# Patient Record
Sex: Male | Born: 1935 | Race: White | Hispanic: No | Marital: Single | Smoking: Never smoker
Health system: Southern US, Community
[De-identification: ages and names within clinical notes are randomized; demographics above are authoritative.]

## PROBLEM LIST (undated history)

## (undated) DIAGNOSIS — F329 Major depressive disorder, single episode, unspecified: Secondary | ICD-10-CM

## (undated) DIAGNOSIS — F32A Depression, unspecified: Secondary | ICD-10-CM

## (undated) DIAGNOSIS — K5909 Other constipation: Secondary | ICD-10-CM

## (undated) DIAGNOSIS — Z842 Family history of other diseases of the genitourinary system: Secondary | ICD-10-CM

## (undated) DIAGNOSIS — D649 Anemia, unspecified: Secondary | ICD-10-CM

## (undated) DIAGNOSIS — E46 Unspecified protein-calorie malnutrition: Secondary | ICD-10-CM

## (undated) HISTORY — DX: Depression, unspecified: F32.A

## (undated) HISTORY — DX: Other constipation: K59.09

## (undated) HISTORY — DX: Major depressive disorder, single episode, unspecified: F32.9

## (undated) HISTORY — DX: Anemia, unspecified: D64.9

## (undated) HISTORY — DX: Unspecified protein-calorie malnutrition: E46

## (undated) HISTORY — PX: OTHER SURGICAL HISTORY: SHX169

## (undated) HISTORY — DX: Family history of other diseases of the genitourinary system: Z84.2

---

## 2002-05-27 ENCOUNTER — Encounter: Payer: Self-pay | Admitting: Emergency Medicine

## 2002-05-27 ENCOUNTER — Emergency Department (HOSPITAL_COMMUNITY): Admission: EM | Admit: 2002-05-27 | Discharge: 2002-05-27 | Payer: Self-pay | Admitting: Emergency Medicine

## 2002-06-05 ENCOUNTER — Encounter: Payer: Self-pay | Admitting: Emergency Medicine

## 2002-06-05 ENCOUNTER — Encounter: Payer: Self-pay | Admitting: Internal Medicine

## 2002-06-05 ENCOUNTER — Inpatient Hospital Stay (HOSPITAL_COMMUNITY): Admission: EM | Admit: 2002-06-05 | Discharge: 2002-06-08 | Payer: Self-pay | Admitting: Emergency Medicine

## 2002-06-07 ENCOUNTER — Encounter (INDEPENDENT_AMBULATORY_CARE_PROVIDER_SITE_OTHER): Payer: Self-pay

## 2004-08-04 ENCOUNTER — Inpatient Hospital Stay (HOSPITAL_COMMUNITY): Admission: EM | Admit: 2004-08-04 | Discharge: 2004-08-11 | Payer: Self-pay | Admitting: Emergency Medicine

## 2004-08-06 ENCOUNTER — Ambulatory Visit: Payer: Self-pay | Admitting: Cardiology

## 2004-08-06 ENCOUNTER — Encounter: Payer: Self-pay | Admitting: Cardiology

## 2006-05-06 ENCOUNTER — Emergency Department (HOSPITAL_COMMUNITY): Admission: EM | Admit: 2006-05-06 | Discharge: 2006-05-06 | Payer: Self-pay | Admitting: Emergency Medicine

## 2006-09-13 ENCOUNTER — Emergency Department (HOSPITAL_COMMUNITY): Admission: EM | Admit: 2006-09-13 | Discharge: 2006-09-13 | Payer: Self-pay | Admitting: *Deleted

## 2008-01-06 ENCOUNTER — Inpatient Hospital Stay (HOSPITAL_COMMUNITY): Admission: AD | Admit: 2008-01-06 | Discharge: 2008-01-14 | Payer: Self-pay

## 2008-01-06 ENCOUNTER — Encounter: Payer: Self-pay | Admitting: Emergency Medicine

## 2008-01-07 ENCOUNTER — Encounter: Payer: Self-pay | Admitting: Family Medicine

## 2008-01-07 LAB — CONVERTED CEMR LAB
B-12, serum: 470 pg/mL
TIBC: 175 ug/dL
TSH: 2.168 microintl units/mL

## 2008-01-11 ENCOUNTER — Encounter: Payer: Self-pay | Admitting: Family Medicine

## 2008-01-11 LAB — CONVERTED CEMR LAB
HCT: 31.5 %
Hemoglobin: 11 g/dL
MCV: 99.2 fL
platelet count: 169 10*3/uL

## 2008-01-12 ENCOUNTER — Encounter: Payer: Self-pay | Admitting: Family Medicine

## 2008-01-12 LAB — CONVERTED CEMR LAB
ALT: 12 units/L
Albumin: 2.9 g/dL
CO2, serum: 27 mmol/L
Chloride, Serum: 105 mmol/L
Creatinine, Ser: 0.94 mg/dL
Glucose, Bld: 99 mg/dL
Potassium, serum: 4.4 mmol/L
Sodium, serum: 137 mmol/L
Total Bilirubin: 0.8 mg/dL

## 2008-01-14 ENCOUNTER — Encounter: Payer: Self-pay | Admitting: Family Medicine

## 2008-01-14 ENCOUNTER — Ambulatory Visit: Payer: Self-pay | Admitting: Family Medicine

## 2008-01-14 DIAGNOSIS — K5909 Other constipation: Secondary | ICD-10-CM | POA: Insufficient documentation

## 2008-01-14 DIAGNOSIS — Z87898 Personal history of other specified conditions: Secondary | ICD-10-CM | POA: Insufficient documentation

## 2008-01-14 DIAGNOSIS — D649 Anemia, unspecified: Secondary | ICD-10-CM | POA: Insufficient documentation

## 2008-01-14 DIAGNOSIS — H269 Unspecified cataract: Secondary | ICD-10-CM

## 2008-01-14 DIAGNOSIS — F329 Major depressive disorder, single episode, unspecified: Secondary | ICD-10-CM | POA: Insufficient documentation

## 2008-01-14 DIAGNOSIS — E46 Unspecified protein-calorie malnutrition: Secondary | ICD-10-CM | POA: Insufficient documentation

## 2008-02-04 ENCOUNTER — Encounter: Payer: Self-pay | Admitting: Family Medicine

## 2008-03-16 ENCOUNTER — Ambulatory Visit: Payer: Self-pay | Admitting: Family Medicine

## 2008-03-16 ENCOUNTER — Encounter: Payer: Self-pay | Admitting: Family Medicine

## 2008-03-22 ENCOUNTER — Encounter (INDEPENDENT_AMBULATORY_CARE_PROVIDER_SITE_OTHER): Payer: Self-pay | Admitting: Family Medicine

## 2008-04-07 ENCOUNTER — Encounter: Payer: Self-pay | Admitting: Family Medicine

## 2008-04-07 DIAGNOSIS — F323 Major depressive disorder, single episode, severe with psychotic features: Secondary | ICD-10-CM

## 2008-04-19 ENCOUNTER — Encounter: Payer: Self-pay | Admitting: Family Medicine

## 2008-04-21 ENCOUNTER — Telehealth: Payer: Self-pay | Admitting: Family Medicine

## 2008-05-20 ENCOUNTER — Ambulatory Visit: Payer: Self-pay | Admitting: Family Medicine

## 2008-07-05 ENCOUNTER — Ambulatory Visit: Payer: Self-pay | Admitting: Family Medicine

## 2008-11-07 ENCOUNTER — Ambulatory Visit: Payer: Self-pay | Admitting: Family Medicine

## 2008-11-08 LAB — CONVERTED CEMR LAB
AST: 16 units/L (ref 0–37)
Alkaline Phosphatase: 51 units/L (ref 39–117)
BUN: 13 mg/dL (ref 6–23)
CO2: 22 meq/L (ref 19–32)
Chloride: 106 meq/L (ref 96–112)
Creatinine, Ser: 1.08 mg/dL (ref 0.40–1.50)
HCT: 39.3 % (ref 39.0–52.0)
Hemoglobin: 12.5 g/dL — ABNORMAL LOW (ref 13.0–17.0)
Platelets: 209 10*3/uL (ref 150–400)
RBC: 4.07 M/uL — ABNORMAL LOW (ref 4.22–5.81)
RDW: 14.9 % (ref 11.5–15.5)
Sodium: 142 meq/L (ref 135–145)
Total Bilirubin: 0.4 mg/dL (ref 0.3–1.2)

## 2009-04-27 ENCOUNTER — Ambulatory Visit: Payer: Self-pay | Admitting: Family Medicine

## 2009-07-06 IMAGING — CT CT HEAD W/O CM
1 series · 16 of 30 positions shown, 20 images · non-contrast
Comparison: 09/13/2006

CLINICAL DATA: Syncope.  Fall.

CT HEAD WITHOUT CONTRAST
TECHNIQUE: Contiguous axial images were obtained from the base of
the skull through the vertex without contrast.

[Series 2: head 4.8 h37s · axial · 0.44mm/px · z∈[-140,+16]mm · 16 of 36 slices shown, 20 images]
[im 2/36  brain]
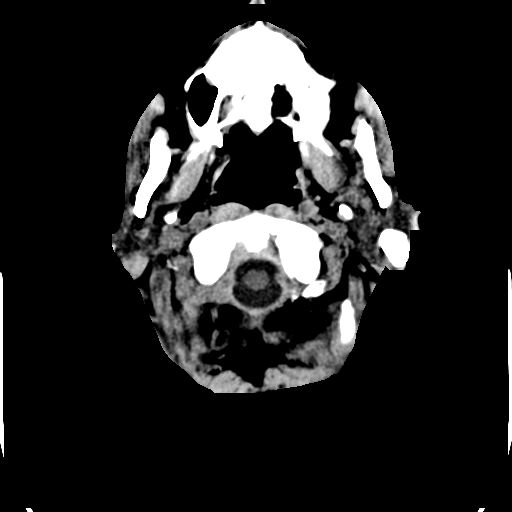
[im 2/36  bone]
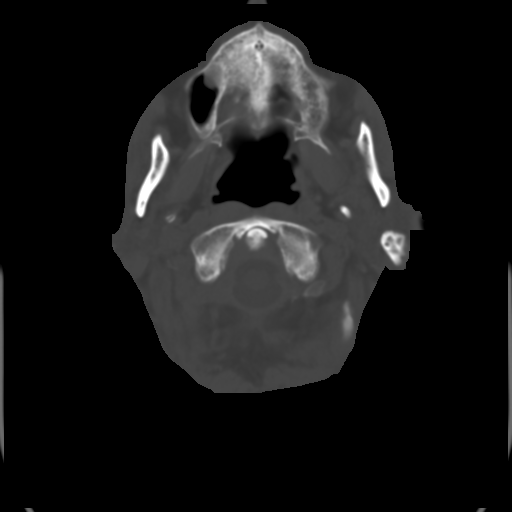
[im 4/36  brain]
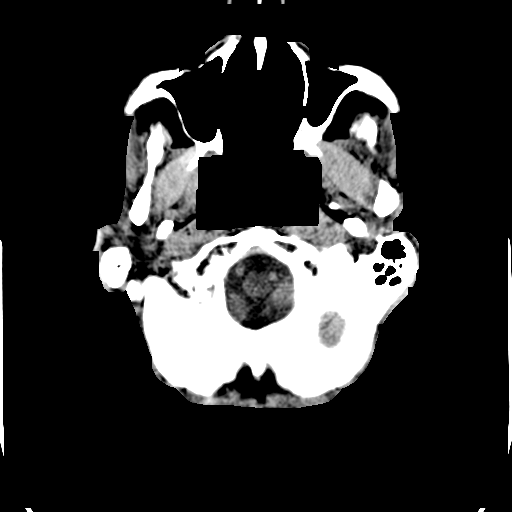
[im 7/36  brain]
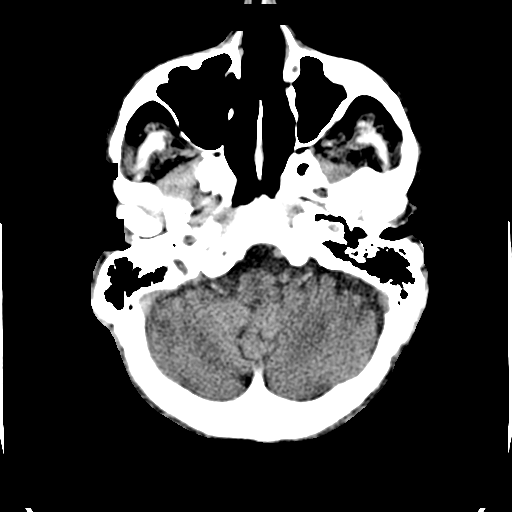
[im 9/36  brain]
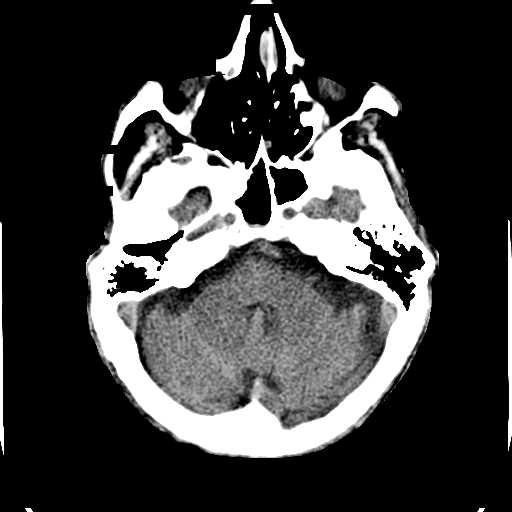
[im 10/36  brain]
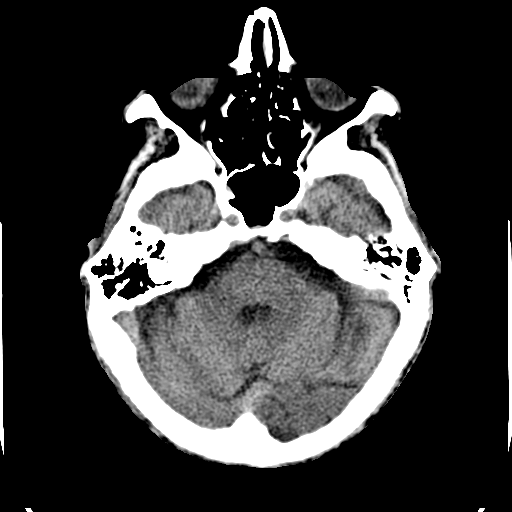
[im 10/36  bone]
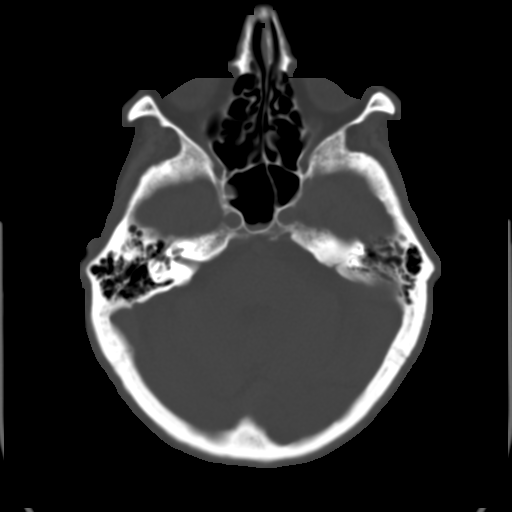
[im 13/36  brain]
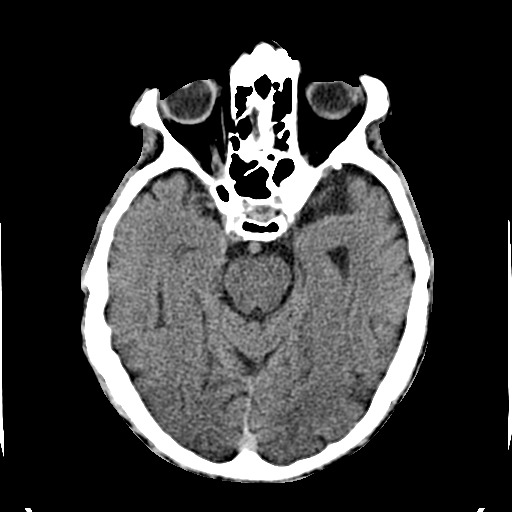
[im 15/36  brain]
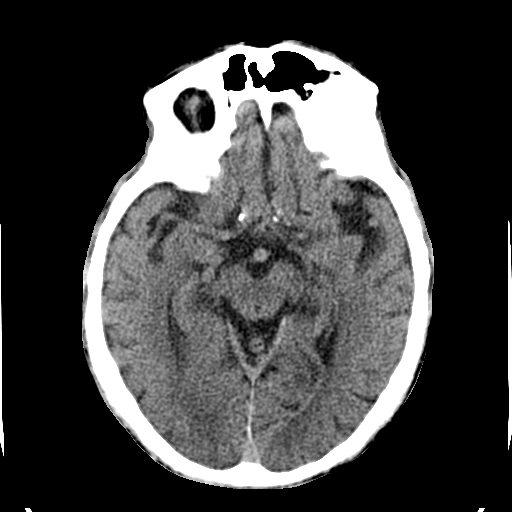
[im 17/36  brain]
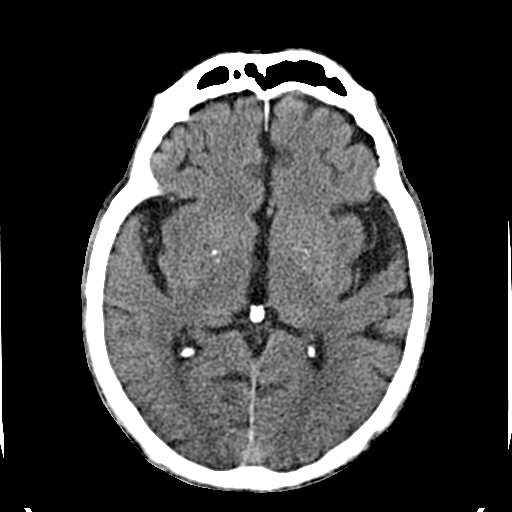
[im 19/36  brain]
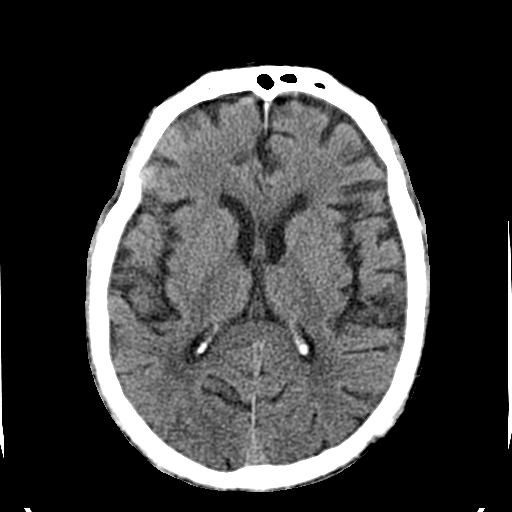
[im 19/36  bone]
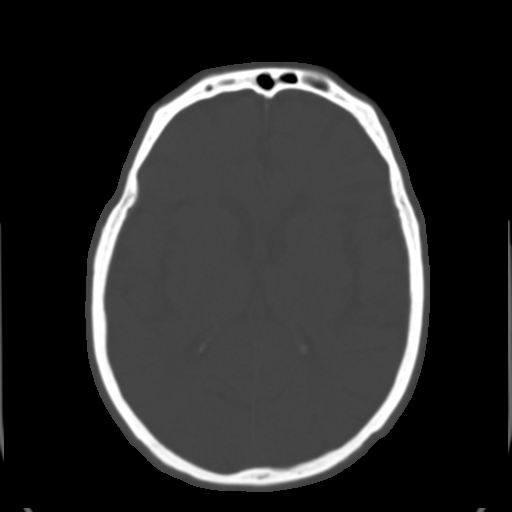
[im 21/36  brain]
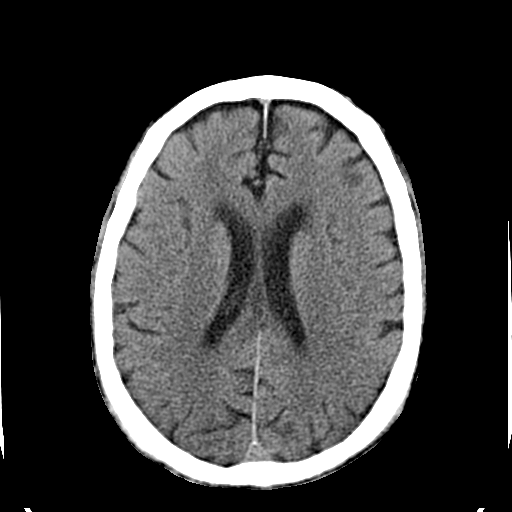
[im 23/36  brain]
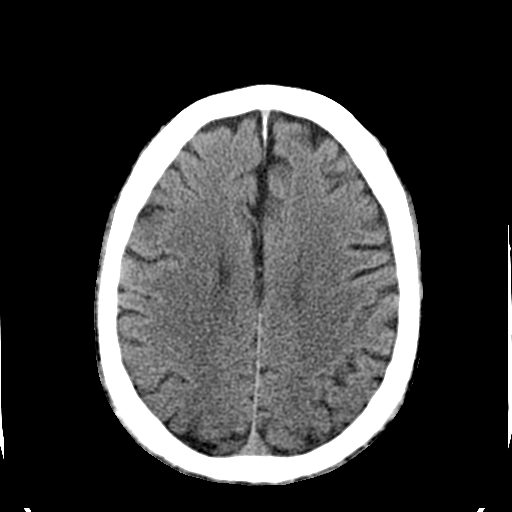
[im 26/36  brain]
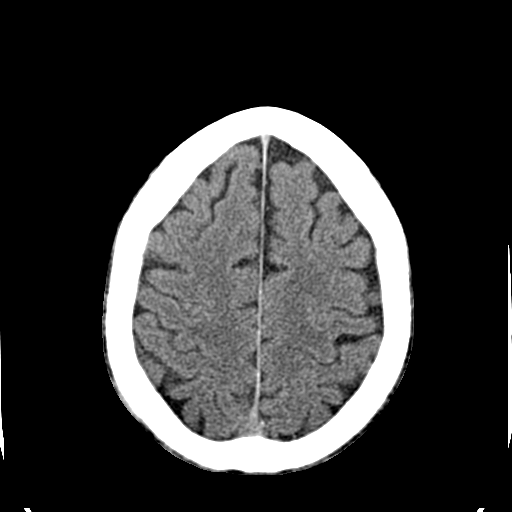
[im 27/36  brain]
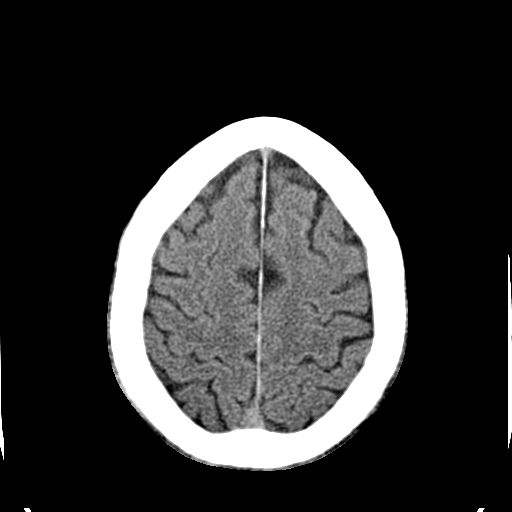
[im 27/36  bone]
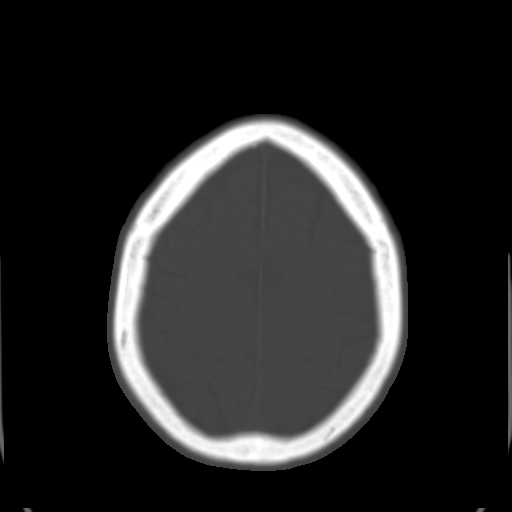
[im 29/36  brain]
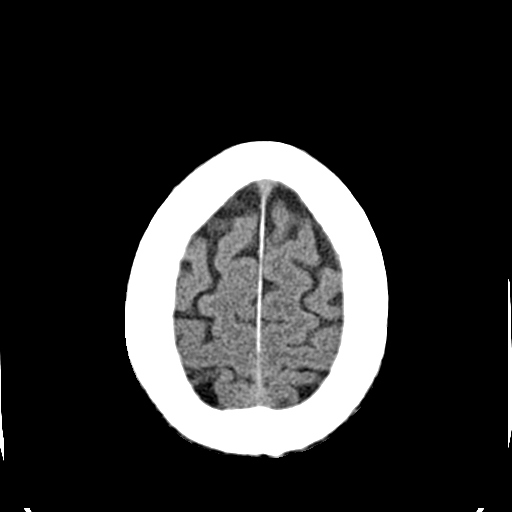
[im 32/36  brain]
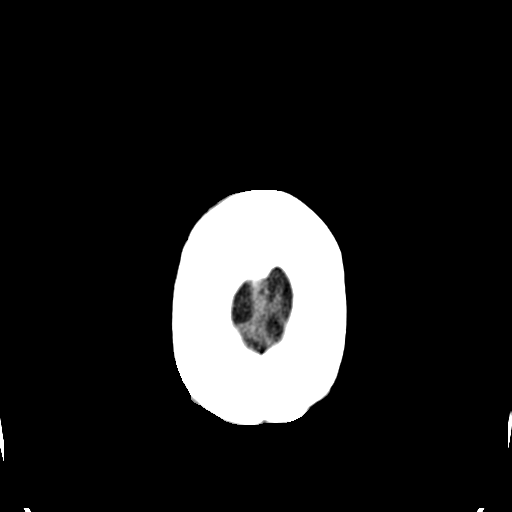
[im 34/36  brain]
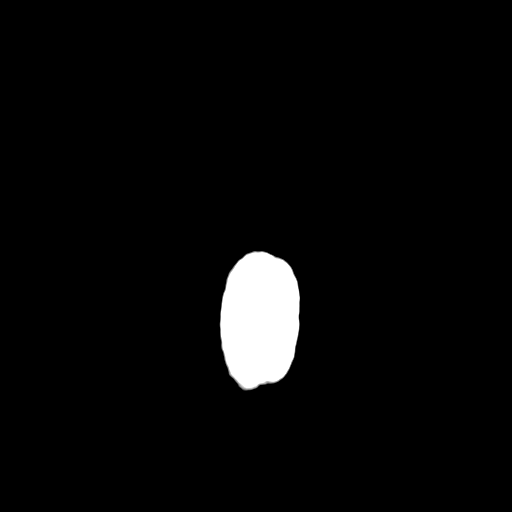

[16 of 30 positions shown; findings below may reference images not displayed]

FINDINGS: There is no evidence for acute hemorrhage, hydrocephalus,
mass lesion, or abnormal extra-axial fluid collection.  No definite
CT evidence for acute infarct.  Diffuse loss of parenchymal volume
is consistent with atrophy. Patchy low attenuation in the deep
hemispheric and periventricular white matter is nonspecific, but
likely reflects chronic microvascular ischemic demyelination.

Chronic maxillary sinus disease noted bilaterally.  Air-fluid level
in the sphenoid sinus raises the question of an acute on chronic
paranasal sinusitis.  The mastoid air cells are clear bilaterally.
IMPRESSION: No acute intracranial abnormality.

Atrophy.

Chronic small vessel white matter disease.

Acute on chronic paranasal sinusitis.

Areas of acute ischemia may be inapparent on CT examination of the
brain for up to 24-48 hours.

## 2009-07-07 IMAGING — US US ABDOMEN COMPLETE
1 series · 14 of 25 positions shown · non-contrast
Comparison: CT 08/04/2004 study.

CLINICAL DATA: History given of anorexia and history of gallstones.
History of right upper quadrant abdominal pain.

ABDOMEN ULTRASOUND
TECHNIQUE: Complete abdominal ultrasound examination was performed
including evaluation of the liver, gallbladder, bile ducts,
pancreas, kidneys, spleen, IVC, and abdominal aorta.

[Series 1: unknown · 0.27mm/px · 14 of 80 slices shown]
[im 1/80]
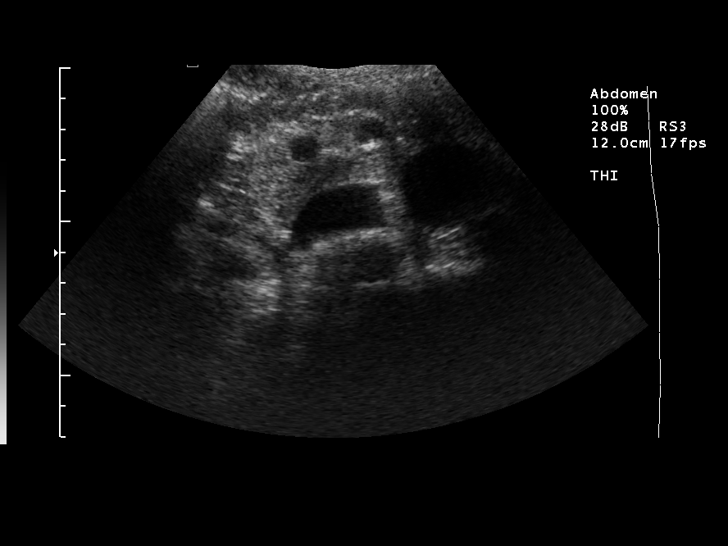
[im 7/80]
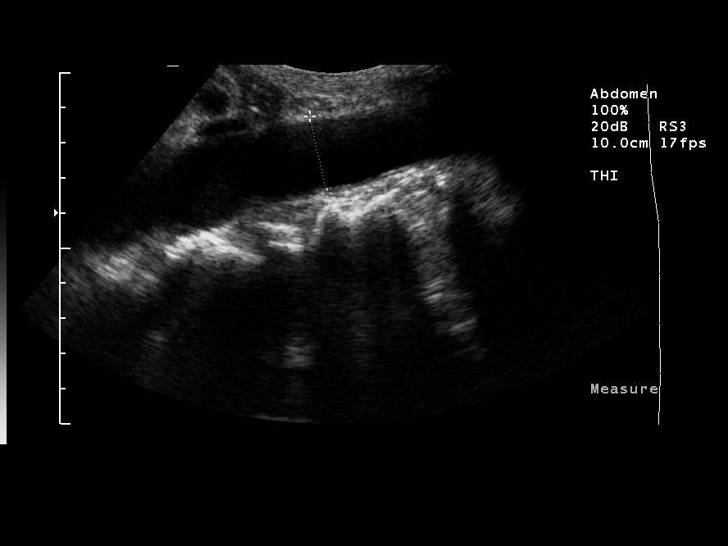
[im 14/80]
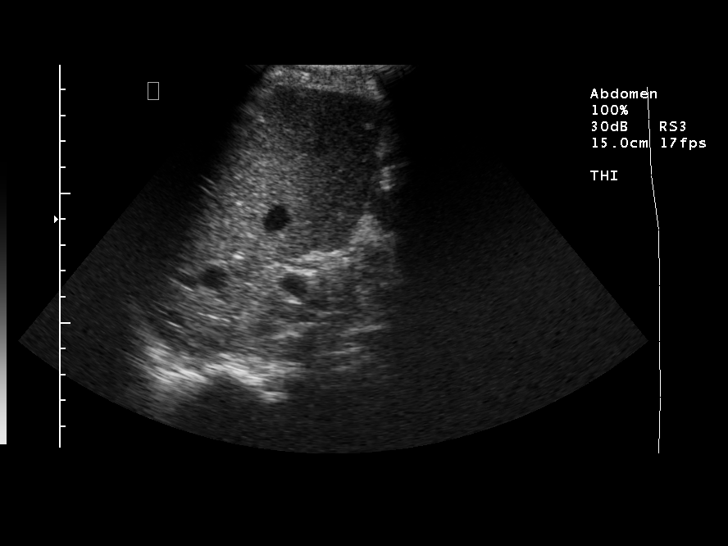
[im 20/80]
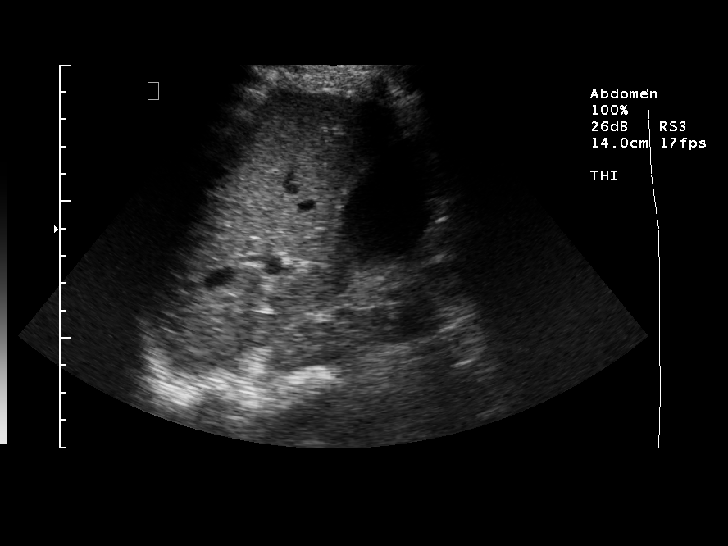
[im 27/80]
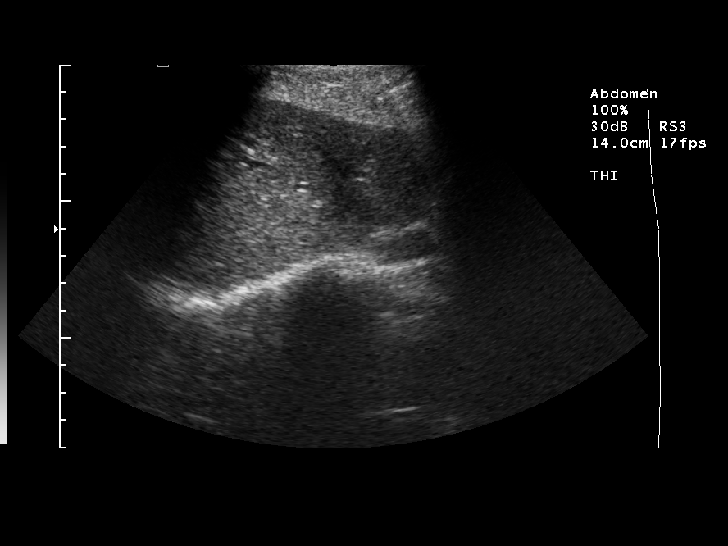
[im 30/80]
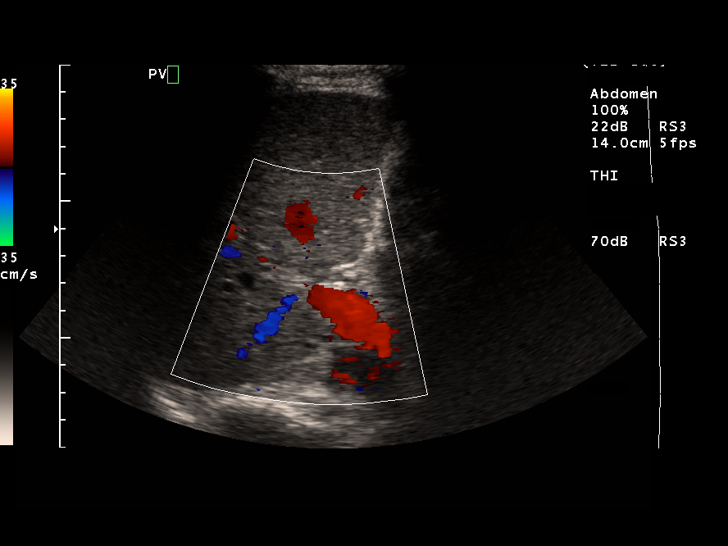
[im 37/80]
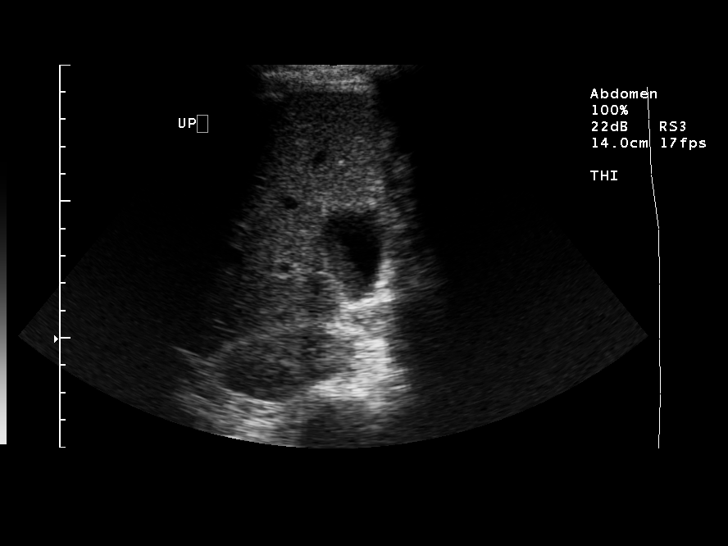
[im 43/80]
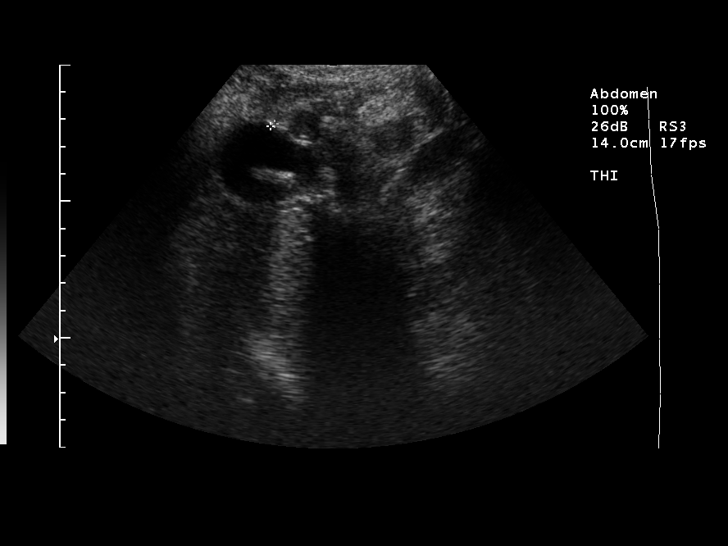
[im 50/80]
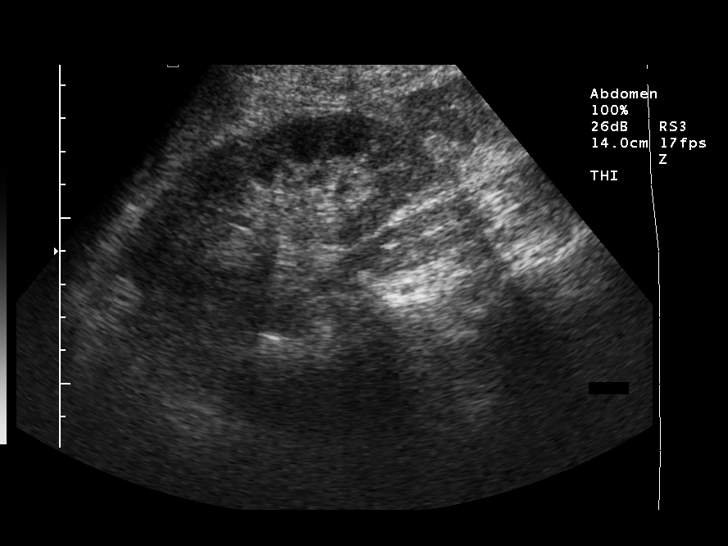
[im 53/80]
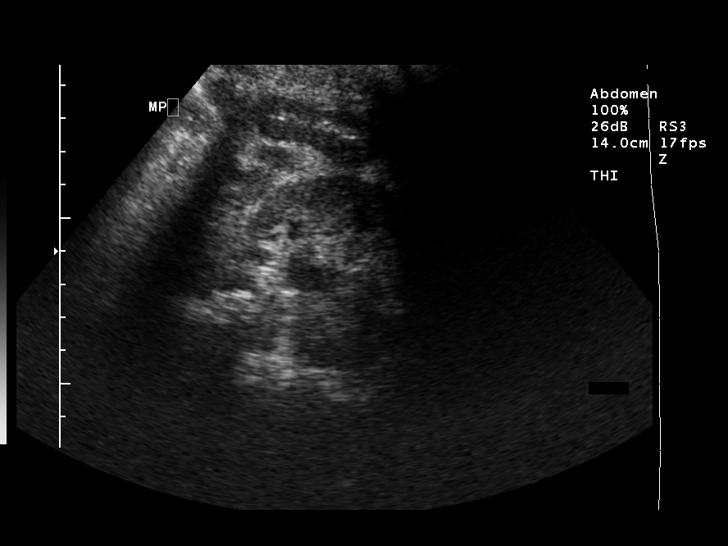
[im 60/80]
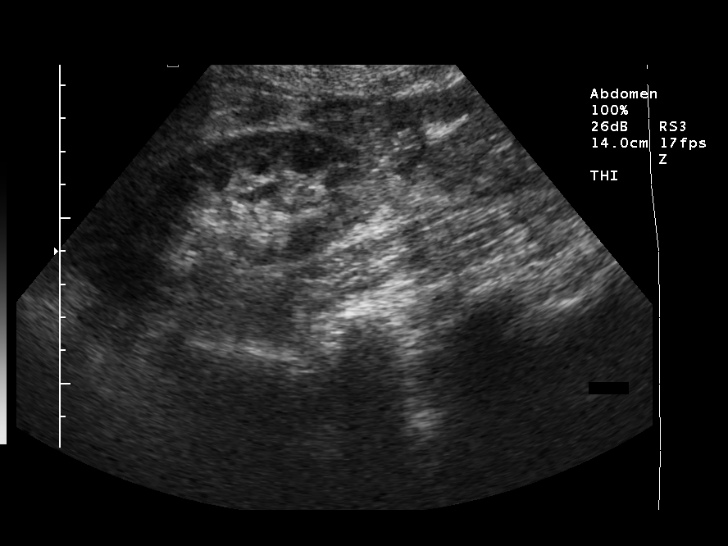
[im 66/80]
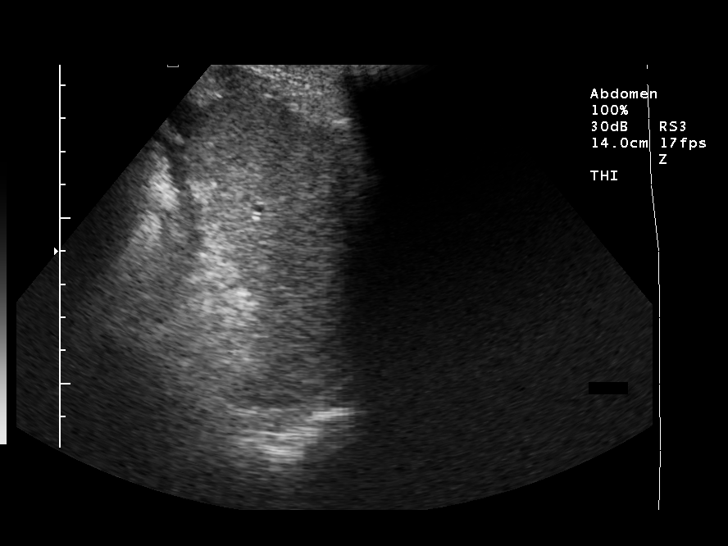
[im 73/80]
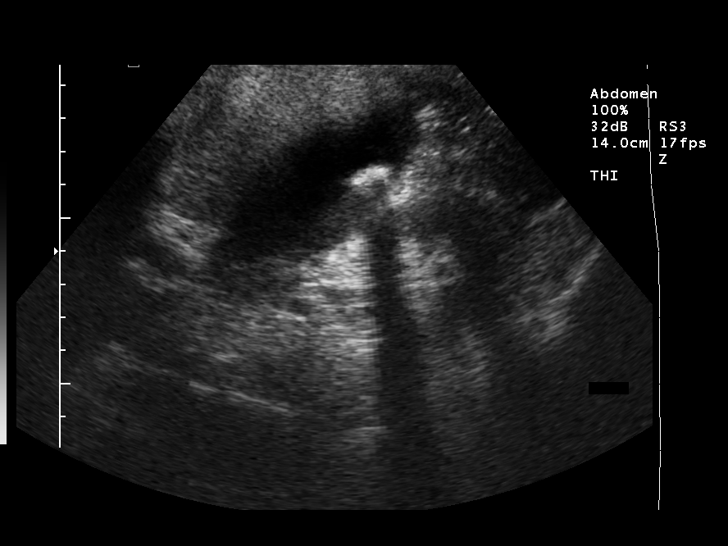
[im 80/80]
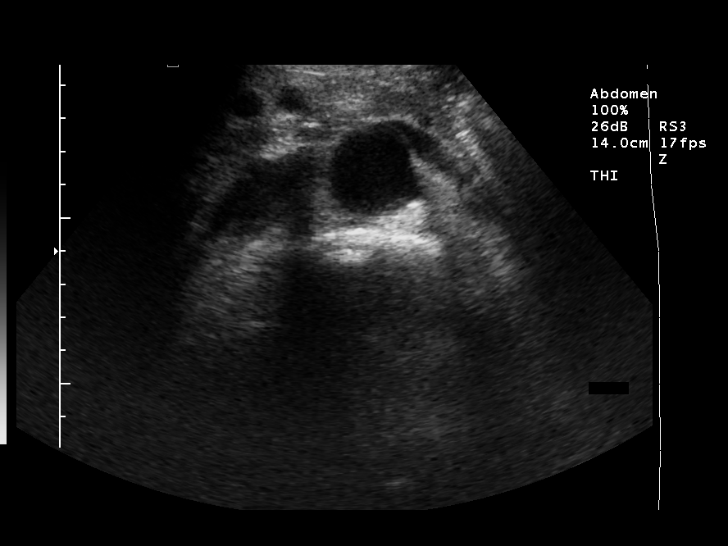

[14 of 25 positions shown; findings below may reference images not displayed]

FINDINGS: No abnormality of head and body of pancreas is seen.  The
tail of the pancreas was obscured by bowel gas and cannot be
evaluated.

Maximum axial diameter abdominal aorta is 3.4 cm.  No aneurysm is
evident.

No sonographic Murphy's sign is present.  Gallbladder wall
thickness is 1.7 mm.  A gallstone is seen within the gallbladder
with calculus maximum diameter of 13.3 mm.

Common bile duct measured 4.3 mm.  No choledocholithiasis is seen.

There is a normal appearance of inferior vena cava and the liver.

Splenic length is 7.9 cm.  No splenic abnormality is identified.

Right renal length is 10.7 cm.  Left renal length is 10.0 cm.  No
renal abnormality is seen.

No ascites is seen.
IMPRESSION: Cholelithiasis.

## 2009-12-19 ENCOUNTER — Ambulatory Visit: Payer: Self-pay | Admitting: Family Medicine

## 2009-12-20 LAB — CONVERTED CEMR LAB
AST: 15 units/L (ref 0–37)
Albumin: 4 g/dL (ref 3.5–5.2)
Calcium: 8.9 mg/dL (ref 8.4–10.5)
Creatinine, Ser: 1.3 mg/dL (ref 0.40–1.50)
PSA: 0.13 ng/mL (ref 0.10–4.00)

## 2010-05-03 NOTE — Assessment & Plan Note (Signed)
Summary: f/u meds   Vital Signs:  Patient profile:   77 year old male Height:      66.3 inches Weight:      147 pounds O2 Sat:      98 % on Room air Temp:     98.8 degrees F oral Pulse rate:   60 / minute BP sitting:   128 / 76  (left arm) Cuff size:   regular  Vitals Entered By: Payton Spark CMA (April 27, 2009 2:15 PM)  O2 Flow:  Room air CC: F/U. Med refills.   Primary Care Provider:  Seymour Bars DO  CC:  F/U. Med refills..  History of Present Illness: 77 yo WM presents for f/u with mood and BPH.  He is doing well on Remeron and night and his is urinating well with the Finasteride.  He is due for RFs.  His labs updated in Aug 2010.  His appetite has been excellent.  Moving bowel well w/ use of colace.  Mood has been stable.  Getting aroiund house w/o his walker.    Current Medications (verified): 1)  Colace 100 Mg Caps (Docusate Sodium) .Marland Kitchen.. 1 Tablet By Mouth Daily 2)  Mens Multivitamin Plus  Tabs (Multiple Vitamins-Minerals) 3)  Remeron 15 Mg Tabs (Mirtazapine) .Marland Kitchen.. 1 Tablet By Mouth Qhs 4)  Finasteride 5 Mg Tabs (Finasteride) .Marland Kitchen.. 1 Tablet By Mouth Daily  Allergies (verified): No Known Drug Allergies  Past History:  Past Medical History: Reviewed history from 01/14/2008 and no changes required. Current Problems:  BENIGN PROSTATIC HYPERTROPHY, HX OF (ICD-V13.8) ANEMIA, NORMOCYTIC (ICD-285.9) DEPRESSIVE DISORDER NOT ELSEWHERE CLASSIFIED (ICD-311) MALNUTRITION (ICD-263.9) CONSTIPATION, CHRONIC (ICD-564.09)  Past Surgical History: Reviewed history from 01/14/2008 and no changes required. s/p Urologic procedure for BPH  Social History: Reviewed history from 05/20/2008 and no changes required. living with his wife of 70 years was at [facility] 10-09 to Jan 10.  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  Has 1 living daughter [name]), 1 deceased daughter, and 3 living sons.  All his children live in the area and help out often with meals,  household chores.  Has never been a smoker or drinker.    FULL CODE  Resp. Party:  Huel Cote Robben Jagiello:  [phone] [place], Giannot[place]:  [phone] Wife, Wyley Hack:  [phone]  Review of Systems      See HPI  Physical Exam  General:  alert, well-developed, well-nourished, and well-hydrated.  here with daughter. using walker Head:  normocephalic and atraumatic.   Eyes:  s/p bilat cataract surgery Nose:  no nasal discharge.   Mouth:  pharynx pink and moist.   Neck:  no masses.  no visible JVD Lungs:  Normal respiratory effort, chest expands symmetrically. Lungs are clear to auscultation, no crackles or wheezes. Heart:  normal rate, regular rhythm, no murmur, no gallop, and no rub.   Abdomen:  soft and non-tender.   Msk:  no joint tenderness, no joint swelling, and no joint warmth.   Pulses:  2+ radial pulses Extremities:  no LE edema Neurologic:  no tremor.  gait normal Skin:  color normal.   Psych:  Oriented X3 and memory intact for recent and remote.     Impression & Recommendations:  Problem # 1:  DEPRESSIVE DISORDER NOT ELSEWHERE CLASSIFIED (ICD-311) Doing well on REmeron at bedtime with improved sleep and appetitie with an 8 lbs wt gain. Continue current meds.  F/U in 6 mos with fasting labs. His  updated medication list for this problem includes:    Remeron 15 Mg Tabs (Mirtazapine) .Marland Kitchen... 1 tablet by mouth qhs  Problem # 2:  ANEMIA, NORMOCYTIC (ICD-285.9)  Hgb: 12.5 (11/07/2008)   Hct: 39.3 (11/07/2008)   Platelets: 209 (11/07/2008) RBC: 4.07 (11/07/2008)   RDW: 14.9 (11/07/2008)   WBC: 7.1 (11/07/2008) MCV: 96.6 (11/07/2008)   MCHC: 31.8 (11/07/2008) Ferritin: 443 (01/07/2008) Iron: 55 (01/07/2008)   TIBC: 175 (01/07/2008)   Folate: 7.2 (01/07/2008)   TSH: 2.168 (01/07/2008)  Problem # 3:  BENIGN PROSTATIC HYPERTROPHY, HX OF (ICD-V13.8) Doing well on Finasteride.  RFd.  Complete Medication List: 1)  Colace 100 Mg Caps (Docusate sodium)  .Marland Kitchen.. 1 tablet by mouth daily 2)  Mens Multivitamin Plus Tabs (Multiple vitamins-minerals) 3)  Remeron 15 Mg Tabs (Mirtazapine) .Marland Kitchen.. 1 tablet by mouth qhs 4)  Finasteride 5 Mg Tabs (Finasteride) .Marland Kitchen.. 1 tablet by mouth daily  Patient Instructions: 1)  Meds RFd. 2)  Return for f/u with labs in 6 months. Prescriptions: FINASTERIDE 5 MG TABS (FINASTERIDE) 1 tablet by mouth daily  #30.0 Each x 6   Entered and Authorized by:   Seymour Bars DO   Signed by:   Seymour Bars DO on 04/27/2009   Method used:   Electronically to        [facility]* (retail)       [address] [place], [place]  [address]       Ph: [phone]       Fax: [phone]   RxID:   [id number] REMERON 15 MG TABS (MIRTAZAPINE) 1 tablet by mouth qhs  #30.0 Each x 6   Entered and Authorized by:   Seymour Bars DO   Signed by:   Seymour Bars DO on 04/27/2009   Method used:   Electronically to        [facility]* (retail)       [address] [place], [place]  [address]       Ph: [phone]       Fax: [phone]   RxID:   [id number]

## 2010-05-03 NOTE — Assessment & Plan Note (Signed)
Summary: f/u BPH   Vital Signs:  Patient profile:   77 year old male Height:      66.3 inches Weight:      151 pounds BMI:     24.24 O2 Sat:      97 % on Room air Pulse rate:   76 / minute BP sitting:   134 / 80  (left arm) Cuff size:   regular  Vitals Entered By: Payton Spark CMA (December 19, 2009 2:31 PM)  O2 Flow:  Room air CC: F/U.    Primary Care Provider:  Seymour Bars DO  CC:  F/U. Marland Kitchen  History of Present Illness: 77 yo WM presents for f/u visit.  He has been doing excellent per his daughter's report.  He is doing a lot of housework.  He is ambulating iwth just his cane.  He is maintaining a good appetite.  He is married.    He is due for med RFs and flu shot.    He is on Finasteride for BPH which he says is working [employer].  Has not had his PSA checked b/c of his age.       Current Medications (verified): 1)  Colace 100 Mg Caps (Docusate Sodium) .Marland Kitchen.. 1 Tablet By Mouth Daily 2)  Mens Multivitamin Plus  Tabs (Multiple Vitamins-Minerals) 3)  Remeron 15 Mg Tabs (Mirtazapine) .Marland Kitchen.. 1 Tablet By Mouth Qhs 4)  Finasteride 5 Mg Tabs (Finasteride) .Marland Kitchen.. 1 Tablet By Mouth Daily  Allergies (verified): No Known Drug Allergies  Past History:  Past Medical History: Reviewed history from 01/14/2008 and no changes required. Current Problems:  BENIGN PROSTATIC HYPERTROPHY, HX OF (ICD-V13.8) ANEMIA, NORMOCYTIC (ICD-285.9) DEPRESSIVE DISORDER NOT ELSEWHERE CLASSIFIED (ICD-311) MALNUTRITION (ICD-263.9) CONSTIPATION, CHRONIC (ICD-564.09)  Past Surgical History: Reviewed history from 01/14/2008 and no changes required. s/p Urologic procedure for BPH  Family History: Reviewed history from 01/14/2008 and no changes required. Daughter - MI in 42's, deceased 2 Sons - DM No other FHx of CAD  Social History: Reviewed history from 05/20/2008 and no changes required. living with his wife of 70 years was at [facility] 10-09 to Jan 10.  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  Has 1 living daughter [name]), 1 deceased daughter, and 3 living sons.  All his children live in the area and help out often with meals, household chores.  Has never been a smoker or drinker.    FULL CODE  Resp. Party:  Huel Cote Juma Oxley:  [phone] [place], [place]:  [phone] Wife, Lonzo Saulter:  [phone]  Review of Systems      See HPI  Physical Exam  General:  alert, well-developed, well-nourished, and well-hydrated.  here with daughter Eyes:  pupils equal, pupils round, and pupils reactive to light.   Mouth:  edentuous o/p pink and moist Neck:  no masses.   Lungs:  Normal respiratory effort, chest expands symmetrically. Lungs are clear to auscultation, no crackles or wheezes. Heart:  normal rate, regular rhythm, no murmur, no gallop, and no rub.   Skin:  color normal.   Psych:  good eye contact, not anxious appearing, and not depressed appearing.     Impression & Recommendations:  Problem # 1:  DEPRESSIVE TYPE PSYCHOSIS (ICD-298.0) Assessment Improved Doing well. Continue Remeron.  Plan to update a Folstein in the next 6 mos.    Problem # 2:  BENIGN PROSTATIC HYPERTROPHY, HX OF (ICD-V13.8) Doing fairly well on Finasteride. Will have him complete an  AUA symptom score at home and only check a PSA since his is on Finasteride.    Complete Medication List: 1)  Colace 100 Mg Caps (Docusate sodium) .Marland Kitchen.. 1 tablet by mouth daily 2)  Mens Multivitamin Plus Tabs (Multiple vitamins-minerals) 3)  Remeron 15 Mg Tabs (Mirtazapine) .Marland Kitchen.. 1 tablet by mouth qhs 4)  Finasteride 5 Mg Tabs (Finasteride) .Marland Kitchen.. 1 tablet by mouth daily  Other Orders: Flu Vaccine 29yrs + MEDICARE PATIENTS (E4540) Administration Flu vaccine - MCR (J8119) T-Comprehensive Metabolic Panel (14782-95621) T-PSA Total (Medicare Screen Only) (30865-78469)  Patient Instructions: 1)  Continue current meds. 2)  Complete AUA symptom score and bring back in for me  to review. 3)  Update labs today. 4)  Will call you w/ results tomorrow. 5)  Flu shot today. 6)  Return for f/u in 6 mos. Flu Vaccine Consent Questions     Do you have a history of severe allergic reactions to this vaccine? no    Any prior history of allergic reactions to egg and/or gelatin? no    Do you have a sensitivity to the preservative Thimersol? no    Do you have a past history of Guillan-Barre Syndrome? no    Do you currently have an acute febrile illness? no    Have you ever had a severe reaction to latex? no    Vaccine information given and explained to patient? yes    Are you currently pregnant? no    Lot Number:[id number]   Exp Date:09/29/2010   Site Given  Left Deltoid IM-CCC] Prescriptions: FINASTERIDE 5 MG TABS (FINASTERIDE) 1 tablet by mouth daily  #30.0 Each x 6   Entered and Authorized by:   Seymour Bars DO   Signed by:   Seymour Bars DO on 12/19/2009   Method used:   Electronically to        [facility]* (retail)       [address] [place], [place]  [address]       Ph: [phone]       Fax: [phone]   RxID:   [id number] REMERON 15 MG TABS (MIRTAZAPINE) 1 tablet by mouth qhs  #30.0 Each x 6   Entered and Authorized by:   Seymour Bars DO   Signed by:   Seymour Bars DO on 12/19/2009   Method used:   Electronically to        [facility]* (retail)       [address] [place], [place]  [address]       Ph: [phone]       Fax: [phone]   RxID:   [id number]    .lbmedflu

## 2010-05-21 ENCOUNTER — Encounter: Payer: Self-pay | Admitting: Family Medicine

## 2010-06-13 ENCOUNTER — Encounter: Payer: Self-pay | Admitting: Family Medicine

## 2010-06-22 ENCOUNTER — Ambulatory Visit (INDEPENDENT_AMBULATORY_CARE_PROVIDER_SITE_OTHER): Payer: Medicare Other | Admitting: Family Medicine

## 2010-06-22 ENCOUNTER — Encounter: Payer: Self-pay | Admitting: Family Medicine

## 2010-06-22 DIAGNOSIS — F329 Major depressive disorder, single episode, unspecified: Secondary | ICD-10-CM

## 2010-06-22 DIAGNOSIS — F3289 Other specified depressive episodes: Secondary | ICD-10-CM

## 2010-06-22 DIAGNOSIS — Z87898 Personal history of other specified conditions: Secondary | ICD-10-CM

## 2010-06-22 MED ORDER — MIRTAZAPINE 15 MG PO TABS: 15.0000 mg | ORAL_TABLET | Freq: Every day | ORAL | Status: DC

## 2010-06-22 MED ORDER — FINASTERIDE 5 MG PO TABS: 5.0000 mg | ORAL_TABLET | Freq: Every day | ORAL | Status: DC

## 2010-06-22 NOTE — Patient Instructions (Signed)
Continue current medications, RFs done.  Return for follow up with fasting labs in 6 months.  Call me if you need anything!

## 2010-06-22 NOTE — Assessment & Plan Note (Signed)
Doing well.  Stable on Proscar.  RFd today.

## 2010-06-22 NOTE — Progress Notes (Signed)
  Subjective:    Patient ID: Tommy Gross, male    DOB: 1918/03/08, 77 y.o.   MRN: [id number]  HPI 77 yo WM presents for f/u visit.  He is doing well on his Remeron at night which helps his mood and his sleep and appetite.  His denies any problems voiding with use of Proscar.    His daughter denies any problems with his memory.  He has a slight drop off on his hearing.  he helps take care of his wife who is going blind.  He has many family members that pitch in to help with the cooking, cleaning and finances at home.  Neither he or his wife drive.     Review of Systems as per HPI    BP 115/68  Pulse 80  Ht 5\' 6"  (1.676 m)  Wt 160 lb (72.576 kg)  BMI 25.82 kg/m2  SpO2 96%  Objective:   Physical Exam  Constitutional: He appears well-developed and well-nourished.       Here with daughter, Tommy Gross. Using walker  HENT:  Head: Normocephalic and atraumatic.  Eyes:       S/p cataract surgery  Neck: No JVD present.  Cardiovascular: Normal rate, regular rhythm and normal heart sounds.   No murmur heard.      Short pauses every 6-8 beats  Pulmonary/Chest: Effort normal and breath sounds normal. No respiratory distress. He has no wheezes.  Abdominal: Soft. Bowel sounds are normal. He exhibits no distension. There is no tenderness.       No audible AA bruits  Musculoskeletal: He exhibits no edema.  Lymphadenopathy:    He has no cervical adenopathy.  Neurological:       No focal weakness  Skin: Skin is warm and dry.  Psychiatric: He has a normal mood and affect.          Assessment & Plan:

## 2010-06-22 NOTE — Assessment & Plan Note (Signed)
Much improved.  Doing great on Remeron at night which has helped his mood, appetite and sleep.  RFd.  Update labs in 6 mos.

## 2010-08-14 NOTE — Consult Note (Signed)
NAME:  Tommy Gross, Tommy Gross             ACCOUNT NO.:  [id number]   MEDICAL RECORD NO.:  [id number]          PATIENT TYPE:  INP   LOCATION:  3729                         FACILITY:  [facility]   PHYSICIAN:  Antonietta Breach, M.D.  DATE OF BIRTH:  1917/07/03   DATE OF CONSULTATION:  01/07/2008  DATE OF DISCHARGE:                                 CONSULTATION   REASON FOR CONSULTATION:  Depression.   REQUESTING PHYSICIAN:  [facility].   HISTORY OF PRESENT ILLNESS:  Tommy Gross is an 77 year old male  admitted to the [facility] on October 7 due to syncope and  failure to thrive.  Tommy Gross has been experiencing approximately 4  weeks of progressive drop in energy as well as appetite.  He has been  obsessing that he cannot have a bowel movement.  He also has poor  energy.  His son has had to lift him up out of bed and place him at the  dining room table.  The patient also has been experiencing anhedonia and  hopelessness.  He has not had any suicidal thoughts or thoughts of  harming others.  He has no delusions or hallucinations.  He does  maintain intact memory as well as orientation.  He is not agitated or  combative.  Efforts to the verbally support him in order to change  behavior have not helped.  Evaluation of factors such as general medical  problems has not come up with any reversible causes of his symptoms.   PAST PSYCHIATRIC HISTORY:  There is no prior history of depression. In  review of the past medical record, there is no listing of depression or  psychotropic medication.   FAMILY PSYCHIATRIC HISTORY:  None known.   SOCIAL HISTORY:  Tommy Gross lives at home with his wife.  He has three  sons and a daughter.  He does not use alcohol or illegal drugs.  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   PAST MEDICAL HISTORY:  1. Syncope.  2. Failure to thrive.  3. History of constipation.   In review of the past medical record, constipation was listed  in 2004 as  well.   MEDICATIONS:  AMA is reviewed.  Mr. Want is not on any psychotropic  medication.   ALLERGIES:  NO KNOWN DRUG ALLERGIES.   LABORATORY DATA:  Sodium 133, BUN 18, creatinine 0.96, glucose 71.  WBC  5.8, hemoglobin 10.9, platelet count 159,000, SGOT 23, SGPT 10,  magnesium unremarkable.   REVIEW OF SYSTEMS:  Constitutional, HEENT, mouth, neurologic,  psychiatric, cardiovascular, respiratory, gastrointestinal,  genitourinary, skin, musculoskeletal, hematologic, lymphatic, endocrine,  metabolic, all unremarkable.   PHYSICAL EXAMINATION:  VITAL SIGNS:  Temperature 98.5, pulse 72,  respiratory rate 15, blood pressure 94/52, oxygen saturation on room air  97%.  GENERAL APPEARANCE:  [name] is an elderly male lying in a supine  position in his hospital bed with no abnormal involuntary movements.   MENTAL STATUS EXAM:  Tommy Gross has good eye contact.  His attention  span is mildly decreased.  Concentration mildly decreased.  Affect  constricted.  Mood depressed.  He is oriented to all spheres.  Memory  3/3 immediate, 2/3 at recall.  Fund of knowledge and intelligence are  within normal limits.  Speech is soft with normal rate and prosody.  There is no dysarthria.  Thought process logical, coherent, goal-  directed.  No looseness of associations.  Thought content:  No thoughts  of harming himself.  No thoughts of harming others. No delusions, no  hallucinations.  Insight is intact for depression and judgment is  intact.   ASSESSMENT:  AXIS I:  293.83.  Mood disorder, not otherwise specified  (idiopathic factors with possible general medical factors), depressed.  Rule out 296.23 major depressive disorder, single episode, severe.  AXIS II:  None  AXIS III:  See past medical history.  AXIS IV:  General medical.  AXIS V:  55.   Tommy Gross is not at risk to harm himself or others.  He does agree to  call emergency services immediately for any thoughts of  harming himself  or other psychiatric emergency symptoms.   The indications, alternatives and adverse effects of Celexa were  discussed with the patient for anti-depression.  He understands and  would like to proceed.  Would start Celexa 5 mg p.o. q.a.m. then would  increase by 5 mg every 5 days to the target dose of 20 mg p.o. q.a.m.   RECOMMENDATIONS:  Would ask the social worker to arrange followup at one  of the clinics attached to [facility], [facility] or [facility] in their psychiatric departments.   [name] could also benefit with psychotherapy combined with  medication.  Would also continue to monitor the patient for any adverse  effects of Celexa such as nausea.      Antonietta Breach, M.D.  Electronically Signed     JW/MEDQ  D:  01/07/2008  T:  01/08/2008  Job:  [id number]

## 2010-08-14 NOTE — H&P (Signed)
NAME:  Tommy Gross, DOWN             ACCOUNT NO.:  [id number]   MEDICAL RECORD NO.:  [id number]          PATIENT TYPE:  INP   LOCATION:  3729                         FACILITY:  [facility]   PHYSICIAN:  Thomasenia Bottoms, MDDATE OF BIRTH:  [birth year]   DATE OF ADMISSION:  01/06/2008  DATE OF DISCHARGE:                              HISTORY & PHYSICAL   CHIEF COMPLAINT:  Failure to thrive, syncope.   HISTORY OF PRESENT ILLNESS:  Mr. Manville is an 77 year old gentleman  brought in by his family because he nearly passed out.  They also feel  that he is essentially starving himself to death.  The patient has been  so weak over the last couple of days that he has been unable to ambulate  by himself.  The patient's son has been lifting him out of bed and  taking him to the dinner table.  Today, the patient ate a half of a  sausage egg biscuit.  Yesterday, he had may be a bite of creamed potato,  but not much worse.  The patient says he cannot remember.  The family  and the patient say he has lost over 100 pounds in the last year or so.  The patient will eat because he feels like if he eats, he can evacuate  his bowels.  He also has absolutely no appetite, so he is hesitant to  eat because of the concern about his bowels and he says I wish the lord  would just take me.   His past medical history is significant for the fact that he was  hospitalized for the exact same thing in 2006.  The patient would not  eat.  He felt like he could not evacuate his bowels.  He was seen by GI.  At that time, he had a CT scan of his abdomen and pelvis, which was  negative for any significant findings.  He did have enemas, which  relieved his symptoms temporarily and that was the end of the workup at  that time.  The patient did feel better after his several enemas, but he  has the sensation that his bowels will not empty.  He has some  discomfort in his lower pelvic area, so he essentially refuses to eat.  He  does have a little bit of incontinence actually of his bowels.  The  patient is also very concerned about is urinating.  He feels that he  does not urinate enough.   MEDICATIONS ON ARRIVAL:  The patient was prescribed Proscar for BPH.  It  is unclear if he is actually taking that.   SOCIAL HISTORY:  He does not smoke cigarettes, drink alcohol, or use any  illicit drugs.   FAMILY HISTORY:  Noncontributory in this 77 year old.   REVIEW OF SYSTEMS:  The patient is a poor historian.  He denies any  headache.  No night sweats.  No fevers.  No chest pain.  No shortness of  breath.  No lower extremity edema.  He has generalized weakness, some  mild trouble with insomnia.  He says he just cannot move his bowels or  move his urine.  He says his last bowel movement he thinks was weeks  ago.  The patient also has very little appetite and says he has  difficulty swallowing, though he was able to eat half a sausage biscuit  this morning without any difficulty.  All other systems reviewed and he  says all elese is negative.   PHYSICAL EXAMINATION:  VITAL SIGNS:  In the emergency department, his  blood pressure was 111/76, temperature 97.9, pulse 79, respiratory rate  11, O2 sat is 94% on room air.  GENERAL:  He is a very cachectic, elderly, gentleman in no acute  distress.  HEENT:  Normocephalic, atraumatic.  His pupils are equal and round.  Sclerae nonicteric.  Oral mucosa is dry.  NECK:  Supple.  No lymphadenopathy.  No thyromegaly.  No jugular venous  distention.  CARDIAC:  Regular rate and rhythm.  LUNGS:  Clear to auscultation bilaterally.  No wheezes.  No rhonchi.  No  rales.  ABDOMEN:  Scaphoid, nontender.  No hepatosplenomegaly.  He does have  bowel sounds.  EXTREMITIES:  No evidence of clubbing, cyanosis, or pitting edema.  NEUROLOGIC:  The patient is alert and oriented x3.  He is attentive and  appropriate.  His cranial nerves II-XII are intact grossly.  He is  oriented x3.  He can  also say all the days of weeks backwards and  forwards.  He has 5/5 strength in his upper and lower extremities.  His  sensory exam is intact grossly in his upper and lower extremities.  He  has decreased muscle tone throughout.  His skin is sagging throughout.  MUSCULOSKELETAL:  No effusion of his joints.  He has fairly good range  of motion in each of his extremities.  SKIN:  Intact with no open lesions or rashes.   DATA:  The patient's EKG reveals normal sinus rhythm with a rate of 70.  No ST-segment elevation or depression.  He did have an EKG earlier,  which appeared to be atrial fibrillation, but this is a repeat which  appears to be normal.  The patient had an x-ray of the abdomen and  chest, which revealed no active lung disease.  No bowel obstruction.  No  free air.  His white count is 5.1, hemoglobin 12.5, hematocrit 37.1,  platelet count is 180.  Urinalysis reveals rare bacteria, 0-2 wbc's,  normal pH, negative leukocyte esterase, negative nitrite.  Sodium is  141, potassium 2.9, chloride 102, bicarb 24, glucose 82, BUN 22,  creatinine 0.9.  His transaminases are all within normal limits.  His  albumin is actually within normal limits at 4.0.   ASSESSMENT AND PLAN:  1. Hypokalemia likely secondary to not eating.  We will replace.  2. Severe depression.  The patient is essentially refusing to eat      because of this discomfort in his pelvis, which he has been      evaluated for.  He seems to be assessed with his bowels and his      urination, and the family describes it as he has just given up.  I      think there is a good chance of depression as a cause of all these      symptoms, and I think he would be a good candidate for Inpatient      Geriatric Psych Unit once he is medically stable.  He should have a      Psychiatry consult here in the hospital.  3. Severe malnutrition, refusal to eat, decreased appetite.  The      patient just tells me that he does this because he  feels that if he      eats, he cannot evacuate his bowel, so we will give him an enema      tonight and work on cleaning out his bowels and he says he will try      to eat tomorrow, so we can see how he eats once his bowels are      cleaned.  4. Possible dysphagia.  He says he has some difficulty eating and that      gets in the way as well, so, we will have speech evaluate his      swallowing.  Overall given the massive amount of weight loss this      patient has had, his prognosis is actually poor.  Even though he      has been evaluated for this same problem at 2060, he actually      refused to see a doctor after he left the hospital.  He is      agreeable to work up and treatment at this time.      Thomasenia Bottoms, MD  Electronically Signed     CVC/MEDQ  D:  01/06/2008  T:  01/07/2008  Job:  [id number]

## 2010-08-14 NOTE — Discharge Summary (Signed)
NAME:  Tommy Gross, Tommy Gross             ACCOUNT NO.:  [id number]   MEDICAL RECORD NO.:  [id number]          PATIENT TYPE:  INP   LOCATION:  3729                         FACILITY:  [facility]   PHYSICIAN:  Estelle Grumbles, MDDATE OF BIRTH:  1918-01-06   DATE OF ADMISSION:  01/06/2008  DATE OF DISCHARGE:                               DISCHARGE SUMMARY   DISPOSITION:  To nursing facility.   DISCHARGE DIAGNOSES:  1. Malnutrition, anorexia, poor oral intake.  2. Deconditioning.  3. Mood disorder.  4. Anemia, stable.  5. Hyperkalemia.  6. Constipation.   CONSULTS:  1. Speech therapy follow up.  No overt signs of aspiration was noted.      Recommend to continue the regular diet with general safety      precautions.  2. Behavioral Health, Dr. Jeanie Sewer.   DIET:  Pureed, regular diet with assistance.  Encourage water fluid  intake empirically.   DISCHARGE MEDICATIONS:  1. Colace 1 tablet orally daily.  2. Multivitamin 1 tablet orally daily.  3. Celexa 5 mg daily.  4. Megace 400 mg daily.  5. Ensure 1 can orally three times a day.   HOSPITAL COURSE:  [name] presented to the emergency room with  severe malnutrition and near syncope.  The patient was essentially  refusing to eat.  At the time of admission, he was seen to be depressed.  He was evaluated by psychiatry and speech therapy for possible  dysphagia.  No overt signs and symptoms of aspiration was noted .  Recommended to continue the diet with aspiration precautions.  He was  further followed by psychiatrist, Dr. Jeanie Sewer.  He was started on  Celexa 5 mg orally daily for mood disorder and major depression.  Recommended for outpatient follow up with psychiatrist.  He was seen by  physical therapy who recommended nursing home  placement.  Currently, he has reasonable food intake .  Will discharge  him to the nursing facility.  He needs to be followed by nutritionist at  nursing facility, and his food intake has to be  monitored closely.  Will  also recommend to arrange for follow up with psychiatrist as an  outpatient.      Estelle Grumbles, MD  Electronically Signed     TP/MEDQ  D:  01/12/2008  T:  01/12/2008  Job:  [id number]

## 2010-08-14 NOTE — Discharge Summary (Signed)
NAME:  Tommy Gross, Tommy Gross             ACCOUNT NO.:  [id number]   MEDICAL RECORD NO.:  [id number]          PATIENT TYPE:  INP   LOCATION:  3729                         FACILITY:  [facility]   PHYSICIAN:  Elliot Cousin, M.D.    DATE OF BIRTH:  19-May-1917   DATE OF ADMISSION:  01/06/2008  DATE OF DISCHARGE:  01/14/2008                               DISCHARGE SUMMARY   ADDENDUM:  Please see the previous discharge summary dictated on January 12, 2008.  This is an addendum.   HOSPITAL COURSE:  The patient has been stable over the past 48 hours.  The plan is to discharge him to [facility] today  for ongoing rehabilitation.  The discharge diagnoses and medications as  previously dictated remain the same.  The patient is currently stable  and in improved condition.      Elliot Cousin, M.D.  Electronically Signed     DF/MEDQ  D:  01/14/2008  T:  01/14/2008  Job:  [id number]

## 2010-08-17 NOTE — Discharge Summary (Signed)
NAME:  Tommy Gross, Tommy Gross             ACCOUNT NO.:  [id number]   MEDICAL RECORD NO.:  [id number]          PATIENT TYPE:  INP   LOCATION:  3728                         FACILITY:  [facility]   PHYSICIAN:  Mallory Shirk, MD     DATE OF BIRTH:  1917/11/25   DATE OF ADMISSION:  08/04/2004  DATE OF DISCHARGE:  08/11/2004                                 DISCHARGE SUMMARY   DISCHARGE DIAGNOSES:  1.  Orthostatic hypotension secondary to dehydration.  2.  Syncope.  3.  Chronic constipation.  4.  Benign prostatic hypertrophy.   DISCHARGE MEDICATIONS:  1.  Aspirin 81 mg p.o. daily.  2.  Docusate sodium 100 mg p.o. b.i.d.  3.  Protonix 40 mg p.o. daily.  4.  Finasteride 5 mg p.o. daily.  5.  MiraLax 17 grams p.o. daily.  6.  Please note the patients Flomax has been discontinued at this time.   FOLLOWUP APPOINTMENTS:  1.  Dr. Roosvelt Harps [phone] within 3-5 days of discharge.  The patient      will call Dr. Luther Parody on Monday Aug 13, 2004 for an appointment.  2.  Dr. Vernie Ammons, Urology, within 3-5 days.  Dr. Margrett Rud phone number is      [phone].  The patient will call for an appointment.  Please note that      the patient has been discharged with a Foley per recommendation of Dr.      Vernie Ammons during his consult in the hospital.  Further Foley management      deferred to Dr. Vernie Ammons.  3.  Dr. Samule Ohm, Cardiology.  Telephone number [phone].  The patient will      call for an appointment for followup.   HISTORY OF PRESENT ILLNESS:  Mr. Rizzi is a pleasant 77 year old  Caucasian gentleman with a past medical history of significant chronic  constipation, BPH, was in his usual state of health until the morning of Aug 04, 2004, when his son was giving him a hair cut when the patient was seated  in the chair and suddenly lost consciousness.  The son noticed him  unconscious sitting in the chair, drooling from his mouth and nose, son did  not notice any incontinence of bowel or bladder, no knee  jerking or movement  of the arms.  The exact duration of the loss of consciousness is unknown,  EMS was called and the patient was found to be opening eyes to voice only.  At the time of exam in the ED the patient was able to follow commands,  paramedics were unable to obtain blood pressure, he was given a normal  saline bolus in ER, his blood pressure was 136/65.  The patient denied any  symptoms, chest pain, shortness of breath, headache, visual symptoms, fever  or cough.  He denied any symptoms prior to consciousness.  He does have  chronic constipation, chronic lower abdominal pain.  He also has some BPH  for which he was seen by Dr. Vernie Ammons, Urology, and given Flomax, which he  discontinued because he could not afford it, was subsequently started on  Proscar.  The patient was complaining of some difficulty urinating starting  his stream, and whenever he tried to urinate, he had loose bowel movement,  but very little urine.   According to the patients wife, the patient has been taking docusate sodium  at much higher than recommended doses.  He also ingested a significant  amount of Epsom salt in an effort to clean out his bowels.  He had severe  diarrhea and spent a significant amount of time in the bathroom, defecating,  per wife, and his symptoms were seen right after this event.   PAST MEDICAL HISTORY:  1.  Chronic constipation.  2.  BPH.  3.  Chronic lower abdominal pain.   MEDICATIONS ON ADMISSION:  1.  Proscar.  2.  Advil.  3.  Epsom salt daily.   ALLERGIES:  NO KNOWN DRUG ALLERGIES.   PHYSICAL EXAM ON ADMISSION:  GENERAL:  Pleasant 77 year old Caucasian  gentleman, does not appear to be in acute distress.  HEENT:  Normocephalic and atraumatic, PERRL, sclerae anicteric, oropharynx  not erythematous.  NECK:  Supple, no LAD, no JVD.  CARDIOVASCULAR:  S1, S2 regular rate and rhythm.  No murmurs, rubs or  gallops.  LUNGS:  Clear to auscultation bilaterally.  No wheezes,  no rales.  ABDOMEN:  Soft, nondistended, right and left lower quadrant tenderness is  noted.  No upper quadrant tenderness, Murphy's sign negative.  EXTREMITIES:  No cyanosis, clubbing or edema.  NEUROLOGIC:  Nonfocal.  RECTAL:  Moderate-sized prostate with smooth surface.   LABS:  WBC 7.2, hemoglobin 12.4, hematocrit 35.7, platelets 197, MCV 91.4.  Urinalysis negative for nitrites or leukocytes, moderate blood, 7-10 rbcs,  sodium 138, potassium 3.6, chloride 110, carbon dioxide 25, BUN 15,  creatinine 1, glucose 126, calcium 8.2, serum albumin 3.5, total bilirubin  0.5, alkaline phosphatase 71, AST 5, ALT 12, total protein 5.1, INR 1.1, PT  13.8, PTT 38.  CT scan of the head -- no acute abnormalities.  Chest x-ray  negative for acute findings.   CT of abdomen and pelvis -- fluid-filled colon with no obvious obstructing  lesion.  Foley catheter in place.  No abdominal wall hernia or inguinal  hernia.  Multifactorial spinal stenosis secondary to degenerative changes in  the lower lumbar region.   HOSPITAL COURSE:  1.  Chronic constipation.  The patient was given MiraLax and docusate, and      these did not seem to help his chronic constipation.  Please note that      the patient said he was constipated, while he had three __________bowel      movements.  The patients wife stated that the patient had a colonoscopy      two years ago, after that he has noticed some rectal pain.  The patients      wife says that the reason the patient keeps taking laxatives, is he      feels he has not evacuated his bowels completely, because of the pain.      GI consult was requested.  The patient was seen by Dr. Ewing Schlein.  He      recommended the patient be given fleet enemas, upon which the patient      had three bowel movements.  The patient will be discharged with a      followup with Dr. Luther Parody.  The patient will call Dr. Joanette Gula     office for an appointment.   On the day of discharge the  patient had three bowel  movements status post  fleets enema.  The patient was ambulatory, able to walk around the halls  without difficulty.  1.  Orthostatic hypotension.  This is most likely secondary to dehydration.      After IV hydration, the patients orthostatic's resolved.  He was able to      stand up without difficulty and walk to the bathroom without difficulty.      On May 11, 12, and 13, 2006, the patient was negative by orthostatic's.      On the day of discharge the patient said he was back to baseline, he was      able to walk without difficulty.   The patient was evaluated by Cardiology for his syncopal episode and his  orthostatic hypotension. The patient was seen by Dr. Randa Evens.  It has  been determined at this time that the dizziness and syncopal episodes plus  orthostatic hypotension, likely secondary to chronic laxative use and  resultant diarrhea, with modest resultant hypovolemia as a contributor.  Please note that in the hospital the patient was also started on Flomax  which may have been contributed to the diuresis.  At this time, no further  cardiac workup has been planned.  The patient will follow-up with Dr. Samule Ohm  on an outpatient basis.  During his hospital stay, the patient did not  complain of any chest pain, shortness of breath, or any other cardiac  symptoms.  1.  BPH.  The patient was seen by Dr. Bjorn Pippin for his complaint of      difficulty voiding.  Dr. Belva Crome recommendation was to restart the      Flomax and discharge the patient home with a Foley.  Abdominal CT was      unremarkable, except as described above.  The patient will be followed      up by Dr. Vernie Ammons in his office.  The patient has been given Dr.      Margrett Rud phone number, he will call for an appointment.  The patient is      discharged on a Foley with a leg bag.  Further Foley management deferred      to Dr. Vernie Ammons.   DISPOSITION:  The patient was discharged in stable  condition.  On the day of  discharge his vital signs were as follows:  Blood pressure was 108/60, pulse  62, respirations 18, temperature 98.4 with sats 92% on room air.  Also, on  the day of discharge his hemoglobin and hematocrit were 13/38.8, with  platelets of 207, WBC 6.6.  His electrolytes were within normal limits.  The  patient is discharged in stable condition.  His daughter will come and pick  him up.   The patient was advised to return to the emergency department immediately  upon onset of chest pain, shortness of breath, dizziness, or any other  symptom that may need immediate medical attention.      GDK/MEDQ  D:  08/11/2004  T:  08/13/2004  Job:  [id number]   cc:   Althea Grimmer. Luther Parody, M.D.  [address], [address]  [place]  [place] [address]  Fax: [phone]   Veverly Fells. Vernie Ammons, M.D.  [address]  [place]  [place] [address]  Fax: [phone]  Salvadore Farber, M.D. [facility]  [address]  [address]  [place]  [place] [address]

## 2010-08-17 NOTE — H&P (Signed)
NAME:  Tommy Gross, Tommy Gross             ACCOUNT NO.:  [id number]   MEDICAL RECORD NO.:  [id number]          PATIENT TYPE:  INP   LOCATION:  1826                         FACILITY:  [facility]   PHYSICIAN:  Marcie Mowers, M.D.DATE OF BIRTH:  1917-04-28   DATE OF ADMISSION:  08/04/2004  DATE OF DISCHARGE:                                HISTORY & PHYSICAL   PRIMARY CARE PHYSICIAN:  Unassigned.   UROLOGIST:  He has seen a urologist, Dr. Vernie Ammons, in the past.   HISTORY OF PRESENT ILLNESS:  This is an 77 year old Caucasian male with a  past medical history of chronic constipation, prostate problems, who was in  his usual state of health until this morning when his son was giving him a  haircut, and patient was seated in the chair, and he lost consciousness.  The son noticed him unconscious, sitting in the chair, drooling from his  mouth and nose.  The son did not notice any incontinence of his bowels or  bladder, did not notice any jerking movements of his arms and legs.  The  exact duration of the patient being unconscious was not known, perhaps a few  minutes?  Emergency Medical Services were call, and found the patient  opening eyes to voice only, and now following commands.  Paramedics were  unable to obtain his blood pressure.  The patient was given a normal saline  bolus, upon which time his blood pressure increased.  Gradually, the  patient's level of consciousness returned to normal.  In the emergency  department, the blood pressure was 136/65.  Currently, the patient denies  any symptoms of chest pain, shortness of breath, headache, visual symptoms,  fever, cough.  He denies having any symptoms prior to loss of consciousness.  He does have chronic constipation and chronic lower abdominal pain.  He  mentions having prostate problems, for which he saw a urologist last year,  and was given Flomax, which was subsequently discontinued because the  patient could not afford it.  Then he was  started on Proscar.  The patient  mentions having difficulty urinating, starting the stream of urine, and when  he tries to do so, has a loose bowel movement, but very little urine.  This  has been going on for 2-3 months.  The patient has been healthy all his  life, very active until 2 years ago when his moods started fluctuating.  He  gets upset every now and then, not as physically active, and states that he  does not feel happiness in the usual things he does.  The patient mentions  that part of his mood fluctuations are secondary to the constant lower  abdominal pain and chronic constipation.   PAST MEDICAL HISTORY:  1.  Chronic constipation.  He had an admission to [facility] in      2004 secondary to this, and had a colonoscopy with a finding of      rectosigmoid polyps, which were adenomatous.  2.  Prostate problems, for which he has been taking Proscar, but he does not      follow up with  his urologist regularly.  3.  Chronic lower abdominal pain.  The patient takes Advil 200 mg daily for      that.   PAST SURGICAL HISTORY:  None.   MEDICATIONS:  As mentioned above -  1.  Proscar.  2.  Advil.  3.  The patient takes Epsom salts daily to move his bowels.   ALLERGIES:  No known allergies.   FAMILY HISTORY:  The patient's daughter died at the age of 23 from an  episode of myocardial infarction.  Otherwise, no pertinent significant  history.   SOCIAL HISTORY:  The patient denies any use of tobacco, alcohol, or illicit  drugs.  He lives with his wife.  He performs all his activities of daily  living.  His sons and daughter live in nearby areas.   REVIEW OF SYSTEMS:  As per history of present illness.  In addition, the  patient has 2-3 loose bowel movements every day.  The patient mentions  having dizziness whenever he gets up from a lying down or a sitting  position.   PHYSICAL EXAMINATION:  GENERAL APPEARANCE:  This is a pleasant 77 year old  male patient who  appears appropriate for his age, does not appear in acute  distress.  HEENT:  Head is atraumatic and normocephalic.  Extraocular muscles are  intact.  Pupils equally reactive to light.  The pharynx is without any  exudates or erythema.  NECK:  Supple.  No jugular venous distention.  No carotid bruit appreciated.  No thyromegaly.  No lymphadenopathy appreciated.  CARDIOVASCULAR:  S1 and S2 are normal.  Rate and rhythm is regular.  No  murmurs appreciated.  PULMONARY:  Clear to auscultation bilaterally.  Air entry is equal  bilaterally.  ABDOMEN:  Soft, nondistended.  There is the presence of right and left lower  quadrant tenderness.  Bowel sounds are normally active in all 4 quadrants.  There is no tenderness in the right upper quadrant.  Murphy's sign is  negative.  EXTREMITIES:  Without any clubbing, cyanosis, or edema.  NEUROLOGIC:  Cranial nerves II-XII are intact.  Motor examination reveals a  strength of 5/5 in all extremities.  Deep tendon reflexes are 1 to 2+ at all  sites.  Cerebellar testing normal.  Gait not assessed.  Sensory examination  normal.  RECTAL:  Moderate-sized prostate with smooth surface.   LABORATORY DATA:  White count of 7.2 with 76% neutrophils, 16% lymphocytes,  and 7% monocytes.  Hemoglobin 12.4, hematocrit 35.7, platelets 197,000.  MCV  is 91.4.  Urinalysis reveals cloudy urine with negative nitrite and no  leukocytes.  There is moderate blood and 7-10 RBC's.  Sodium 138, potassium  3.6, chloride 110, CO2 of 25, BUN 15, creatinine 1, glucose 126, calcium  8.2.  Serum albumin 3.5.  Total bilirubin 0.5.  Alkaline phosphatase 71.  AST 5, ALT 12, total protein 5.9.  INR of 1.1.  PT 13.8, PTT 28.  CT scan of  the head reveals no acute abnormality.  Chest x-ray is negative for any  acute findings, and overall normal.   ASSESSMENT AND PLAN:  1.  An 77 year old Caucasian male with an episode of syncope.  The possible     etiologies are orthostatic hypotension,  cardiac etiology, neurological      problem.  So far, the CT scan of his head is negative.  The chest x-ray      is negative.  The first set of cardiac enzymes is negative.  The 12-lead  ECG reveals mildly prolonged PR interval of 233 ms.  Will check his      orothostatics. Will start him on normal saline at 100 cc per hour.      Examination does not reveal any carotid bruit.  If clinically indicated,      a 2-D echo can be obtained.  2.  Difficulty urinating.  The patient has a past history of prostate      hypertrophy.  Will attempt a Foley catheter.  Will check PSA.  The      patient will likely need a urology consultation.  Currently, his BUN and      creatinine was normal.  A CT scan of his abdomen and pelvis is pending.  3.  Constipation.  This is chronic.  Will start him on Colace 100 mg p.o.      b.i.d.  The patient has had a colonoscopy 2 years ago, which showed      rectosigmoid polyps, and the biopsy revealed adenomatous polyps.  4.  Prophylaxis.  For deep vein thrombosis and peptic ulcer disease, will      start him on Protonix, SCD boots while in bed.  5.  Symptoms suggestive of depression.  The patient was tearful, and the      overall symptoms he has been having for the last couple years, have been      gradually worsening, such as depression.  At this time, we will not      start      him on any antidepressant medications.  He will need regular outpatient      care, and will need to follow up with his physician.  I gave him      information about Dr. Marga Melnick, as he requested.  The patient is      willing to follow up with him as an outpatient.      PMJ/MEDQ  D:  08/04/2004  T:  08/04/2004  Job:  [id number]

## 2010-08-17 NOTE — Discharge Summary (Signed)
NAME:  Tommy Gross, Tommy Gross                       ACCOUNT NO.:  [id number]   MEDICAL RECORD NO.:  [id number]                   PATIENT TYPE:  INP   LOCATION:  0456                                 FACILITY:  [facility]   PHYSICIAN:  Jackie Plum, M.D.             DATE OF BIRTH:  10-25-1917   DATE OF ADMISSION:  06/05/2002  DATE OF DISCHARGE:  06/08/2002                                 DISCHARGE SUMMARY   DISCHARGE DIAGNOSES:  1. Chronic constipation __________--improved significantly.  2. Rectal sigmoid polyps.     A. Status post colonoscopy on June 07, 2002 by Dr. Luther Parody notable for        rectosigmoid polyps, histology pending and will need outpatient        followup.   DISCHARGE MEDICATIONS:  1. MiraLax 17 gm satchet p.o. daily.  2. Darvocet-N 100 one tablet 6h p.r.n. for pain.  3. Dulcolax 5 mg p.o. p.r.n. for constipation.   ACTIVITIES:  As tolerated.   DIET:  High fiber diet.   SPECIAL INSTRUCTIONS:  The patient is to report to M.D. for any problems.  Patient should call Dr. Luther Parody office for appointment in two weeks for  followup. The patient will also be following with Dr. Milus Glazier of [facility].   REASON FOR HOSPITALIZATION:  The patient presented with left lower quadrant  cramping abdominal pain associated with 2-3 week history of chronic  constipation. Abdominal exam on presentation was notable for minimal  guarding without any rebound, bowel sounds were present and they were  normoactive. The patient was admitted by Dr. Sherin Quarry for further  evaluation.   HOSPITAL COURSE:  The patient was admitted by Dr. Tresa Endo to the hospitalist  service where upon he received supportive care including IV fluids,  lactulose and Fleet's enema with good results. He had an abdominal CT scan  ordered which was unremarkable.   On account of his age with chronic constipation, colonoscopy was done,  results as indicated above. The patient will need a followup  of the biopsy  of the polyp as an outpatient. The patient will be sent home on MiraLax with  p.r.n. Dulcolax as mentioned above. The patient does not have a primary care  physician. I spoke to the son who says that he will be taking his father to  followup with Dr. Milus Glazier of [place] tomorrow.   CONDITION ON DISCHARGE:  Improved.   DISPOSITION:  The patient is going home with his son.   DISCHARGE LABS:  Sodium 136, potassium 3.7, chloride 101, CO2 29, glucose  115, BUN 8, creatinine 1.0. Hemoglobin 10.6, WBC count 8.2, hematocrit  __________, MCV 91.2, platelet count 216. PSA 2.632, CEA less than 0.5, PSA  0.70.   CONSULTATIONS:  Althea Grimmer. Luther Parody, M.D.   PROCEDURE:  Colonoscopy as mentioned above.  Jackie Plum, M.D.    GO/MEDQ  D:  06/08/2002  T:  06/08/2002  Job:  [id number]   cc:   Elvina Sidle, M.D.  [address] [place]  [place] [address]  Fax: [phone]   Althea Grimmer. Luther Parody, M.D.  [address]  [place]  [place] [address]  Fax: [phone]

## 2010-08-17 NOTE — Consult Note (Signed)
NAME:  Tommy Gross, Tommy Gross             ACCOUNT NO.:  [id number]   MEDICAL RECORD NO.:  [id number]          PATIENT TYPE:  INP   LOCATION:  3728                         FACILITY:  [facility]   PHYSICIAN:  Salvadore Farber, M.D. LHCDATE OF BIRTH:  [birth year]   DATE OF CONSULTATION:  08/08/2004  DATE OF DISCHARGE:                                   CONSULTATION   REASON FOR CONSULTATION:  Syncope.   HISTORY OF PRESENT ILLNESS:  Mr. Rorke is an 77 year old gentleman with  no prior history of cerebrovascular disease.  For the past two to three  weeks he describes dizziness on first going from a sitting to a standing  position.  This typically lasts about two to three minutes.  After this his  spell would pass and he was able to set off about his affairs.  He had  never had any dizziness while sitting or lying down until Saturday, May 6.  At that time he was having his hair cut and became unconscious.  EMTs were  called.  First recorded heart rate was 40 and they were unable to obtain a  blood pressure.  IV fluids were administered.  After approximately 30  minutes of IV fluids his level of consciousness improved with heart rate  rising to 76 and systolic blood pressure to 146.  On monitor here he has had  no bradycardia with normal sinus rhythm and an occasional PVC.   He has continued to be dizzy upon going from a sitting to a standing  position.   Over the past several months patient has complained of concerns with  constipation.  He has not actually had any constipation during this period  because he has been taking a stool softener and at least once daily Epsom  salts.  This has resulted in one and often twice daily watery stools.   PAST MEDICAL HISTORY:  1.  BPH.  2.  Chronic lower abdominal pain.   HOME MEDICATIONS:  Proscar, Advair, Epsom salts.   CURRENT MEDICATIONS:  Protonix, Proscar, Flomax, aspirin.   ALLERGIES:  NKDA.   SOCIAL HISTORY:  Patient lives with his wife and  performs all his activities  of daily living.  Children live nearby.  Denies tobacco, alcohol, illicit  drug use.   FAMILY HISTORY:  Daughter died at 60 of a myocardial infarction.  One  brother died at 38 of a malignancy.  One sister died at 66 of unknown cause.  Father died at 56 of myocardial infarction.  Mother died at 71 of unknown  cause.   REVIEW OF SYSTEMS:  Remarkable for urinary hesitancy, chronic arthralgias,  and a 30 pound weight loss over two years.   PHYSICAL EXAMINATION:  GENERAL:  He is fairly frail-appearing, elderly man  in no distress.  VITAL SIGNS:  When lying down heart rate is 80 and blood pressure 120/74.  Upon standing and waiting for a full five minutes the heart rate remains  elevated at 114 with a systolic blood pressure of 79.  No weights have been  recorded during his hospitalization.  I's and O's are incomplete.  HEENT:  Normal with the exception of poor dentition.  SKIN:  Normal.  NECK:  Jugular venous pressure of less than 5 cm.  LUNGS:  Clear to auscultation and percussion bilaterally.  There is no  thyromegaly.  Respiratory effort is normal.  HEART:  He has a regular rate and rhythm without murmur, rub, or gallop.  ABDOMEN:  Soft, nondistended, nontender.  There is no hepatosplenomegaly.  Bowel sounds are hyperactive.  MUSCULOSKELETAL:  Normal with the exception of the fourth digit of the right  hand which has a chronic deformity that she attributes to arthritis.  NEUROLOGIC:  Grossly normal.   CT of the head showed calcification of the carotids and skull base and was  otherwise unremarkable.  Carotid ultrasound showed normal velocities  bilaterally and patent vertebrals bilaterally.  Electrocardiogram  demonstrates normal sinus rhythm with first degree AV block (230  milliseconds).  There is left axis deviation and minor nonspecific ST-T  abnormalities.   Echocardiogram was a poor quality study, but demonstrated preserved left  ventricular  systolic function and no gross valvular abnormality.   Laboratory studies remarkable for sodium 136, potassium 4.1, BUN 7,  creatinine 1.  Hematocrit 35.  TSH 2.7.  Troponin 0.01, 0.02.   IMPRESSION/RECOMMENDATION:  Dizziness/syncope.  Patient is profoundly  orthostatic.  He certainly may have postural orthostasis/tachycardia  syndrome.  However, his chronic laxative abuse and resultant diarrhea raises  the question of modest hypovolemia as a major contributor.  In addition, the  new addition of Flomax may be contributing as well.  He may ultimately need  midodrine and compression stockings for assistance with his orthostasis.  However, will begin with hydration and cessation of the Flomax.   We certainly cannot exclude another cardiac etiology of his syncope but it  seems unlikely given the profound and symptomatic orthostasis demonstrated  here.  His lack of primary bradycardia while monitored here is also  reassuring.      WED/MEDQ  D:  08/08/2004  T:  08/08/2004  Job:  [id number]   cc:   Marcie Mowers, M.D.   Veverly Fells. Vernie Ammons, M.D.  [address]  [place]  [place] [address]  Fax: [phone]

## 2010-08-17 NOTE — Consult Note (Signed)
NAME:  Tommy Gross, Tommy Gross             ACCOUNT NO.:  [id number]   MEDICAL RECORD NO.:  [id number]          PATIENT TYPE:  INP   LOCATION:  3728                         FACILITY:  [facility]   PHYSICIAN:  Excell Seltzer. Annabell Howells, M.D.    DATE OF BIRTH:  18-Sep-1917   DATE OF CONSULTATION:  08/06/2004  DATE OF DISCHARGE:                                   CONSULTATION   He is a patient of Dr. Brien Few.   CHIEF COMPLAINT:  Difficulty voiding.   HISTORY:  This is an 77 year old white male admitted with syncope on Aug 04, 2004. She had a Foley catheter placed for difficulty voiding. He is followed  by Dr. Vernie Ammons and last saw him approximately a year ago. He was due to see  him in the office on Aug 15, 2004. He is followed for BPH, bladder outlet  obstruction, history of retention, and previously was on UroXatral and  Proscar, but he felt like the medications had quit working and has been less  compliant with the medication. He has complained of frequent small voids and  a sensation of incomplete emptying. He denied dysuria or hematuria.   On review of his past history he has no drug allergies.   Current medications include Proscar 5 mg daily, Flomax 0.4 mg daily, also  Protonix 40 mg daily, potassium supplement daily, baby aspirin.   Medical history is significant for the recent syncopal episode, chronic  constipation, history of cholelithiasis and sigmoid polyps. Surgical history  is significant for colonoscopy.   SOCIAL HISTORY:  No tobacco or alcohol.   FAMILY HISTORY:  Unremarkable.   REVIEW OF SYSTEMS:  He has chronic constipation, depressed mood, recent loss  of consciousness, but no chest pain or shortness of breath. He is otherwise  without complaints.   PHYSICAL EXAMINATION:  A well-developed, well-nourished, elderly white male  in no acute distress. He has poor dentition. Neurologically, he has no focal  deficits. His abdomen is soft, flat, and nontender without masses,  hepatosplenomegaly,  or CVA tenderness. No hernias are noted. GU exam reveals  an unremarkable phallus with an adequate meatus, Foley indwelling. Scrotum  is unremarkable. Testicles are descended without masses. Epididymis  unremarkable. Anus and perineum without lesions. Rectal examination reveals  normal sphincter tone. Prostate is 1 to 2+  __________ without nodules.  Seminal vesicles are nonpalpable. No rectal masses are noted.   Admission urine shows 7-10 red cells. This may be due to cath trauma. PSA  was 0.55. CT of the abdomen is pending.   IMPRESSION:  1.  Benign prostatic hypertrophy, bladder outlet obstruction, history of      retention with increased problems recently.  2.  Hematuria possibly secondary to catheterization.  3.  Chronic constipations.  4.  Recent syncopal episode.   RECOMMENDATIONS:  1.  I would agree with Flomax, but in light of syncope would monitor BP      closely.  2.  Continue Proscar.  3.  He needs follow up with Dr. Vernie Ammons for possible cystoscopy and voiding      trials as well as evaluation of his  micro hematuria on Aug 15, 2004.  He      should be discharged home with a Foley.  We will need to review a CT      report.      JJW/MEDQ  D:  08/06/2004  T:  08/06/2004  Job:  [id number]   cc:   Isidor Holts, M.D.   Veverly Fells. Vernie Ammons, M.D.  [address]  [place]  [place] [address]  Fax: [phone]

## 2010-08-17 NOTE — Consult Note (Signed)
NAME:  Tommy Gross, Tommy Gross             ACCOUNT NO.:  [id number]   MEDICAL RECORD NO.:  [id number]          PATIENT TYPE:  INP   LOCATION:  3728                         FACILITY:  [facility]   PHYSICIAN:  Petra Kuba, M.D.    DATE OF BIRTH:  Aug 17, 1917   DATE OF CONSULTATION:  08/10/2004  DATE OF DISCHARGE:                                   CONSULTATION   HISTORY:  The patient was seen at the request of the [facility] medical team  for constipation.  He has been followed by my partner, Dr. Luther Parody, in the  past with a colonoscopy two years ago with some small polyps.  He has had  this problem for years.  He does have some lower abdominal and minimal  rectal discomfort.  He has been seen by the urologist as well for problems  urinating.  A rectal exam was not revealing.  A CT scan was also done, which  did show some slight colon dilatation with some increased fluid, but no  obvious mass or obstruction.  We were consulted for further workup and  plans.  He has had no nausea or vomiting.  He has been bedridden since this  admission for syncope.  No other specific GI complaints.   PAST MEDICAL HISTORY:  Pertinent for history of BPH and chronic  constipation.  He has not had any surgeries.   MEDICINES:  Current medications include Protonix, Proscar, aspirin,  Dulcolax, MiraLax and Colace.   ALLERGIES:  None.   FAMILY HISTORY:  Noncontributory.   SOCIAL HISTORY:  He lives with his wife.  Denies tobacco or alcohol.   REVIEW OF SYSTEMS:  Negative, except above.   PHYSICAL EXAMINATION:  VITAL SIGNS:  Stable.  Afebrile.  ABDOMEN:  Soft and nontender.  RECTAL:  Exam negative by the urologist the other day.   LABORATORY DATA:  CT reviewed with Dr. Lorin Picket.  Laboratories pertinent for  normal white count, hemoglobin of 12.2 and normal platelet count.  Normal  coagulation times.  Normal BUN and creatinine.  Albumin 3.5.  Normal liver  tests.   ASSESSMENT:  1.  Admission for syncope.  2.   Chronic constipation and lower abdominal pain.   PLAN:  Will go ahead and try magnesium citrate and then if that does not  work, enemas.  In the meantime, increase his MiraLax.  Might even try  Zelnorm at some point in the future.  Will check on him tomorrow and see how  the above recommendations work.  Otherwise Dr. Luther Parody can follow him up  in the office.  Will possibly try the new medicine that just came out for  constipation if needed.      MEM/MEDQ  D:  08/10/2004  T:  08/12/2004  Job:  [id number]   cc:   Althea Grimmer. Luther Parody, M.D.  [address]  [place]  [place] [address]  Fax: [phone]

## 2010-12-31 LAB — BASIC METABOLIC PANEL
BUN: 10
CO2: 23
CO2: 27
Chloride: 102
Chloride: 105
Creatinine, Ser: 0.94
GFR calc Af Amer: >60
GFR calc Af Amer: >60
GFR calc non Af Amer: >60
GFR calc non Af Amer: >60
Glucose, Bld: 103 — ABNORMAL HIGH
Potassium: 4.2
Potassium: 4.4
Sodium: 133 — ABNORMAL LOW
Sodium: 138

## 2010-12-31 LAB — CBC
HCT: 31.5 — ABNORMAL LOW
HCT: 31.8 — ABNORMAL LOW
Hemoglobin: 10.1 — ABNORMAL LOW
Hemoglobin: 11 — ABNORMAL LOW
MCHC: 34.6
MCV: 100.3 — ABNORMAL HIGH
MCV: 99.2
Platelets: 159
RBC: 3.18 — ABNORMAL LOW
RBC: 3.18 — ABNORMAL LOW
RDW: 14.3
RDW: 14.3
WBC: 5
WBC: 5.8

## 2010-12-31 LAB — COMPREHENSIVE METABOLIC PANEL
AST: 19
Albumin: 2.9 — ABNORMAL LOW
Alkaline Phosphatase: 29 — ABNORMAL LOW
Calcium: 8.3 — ABNORMAL LOW
GFR calc non Af Amer: >60
Glucose, Bld: 103 — ABNORMAL HIGH

## 2010-12-31 LAB — IRON AND TIBC
Iron: 55
TIBC: 175 — ABNORMAL LOW

## 2010-12-31 LAB — DIFFERENTIAL
Eosinophils Relative: 1
Lymphocytes Relative: 35
Lymphs Abs: 1.8
Monocytes Absolute: 0.4
Monocytes Relative: 8
Neutro Abs: 2.8

## 2010-12-31 LAB — FOLATE: Folate: 7.2

## 2010-12-31 LAB — FERRITIN: Ferritin: 443 — ABNORMAL HIGH (ref 22–322)

## 2010-12-31 LAB — TSH: TSH: 2.168

## 2011-01-01 LAB — COMPREHENSIVE METABOLIC PANEL
ALT: 10
AST: 23
Albumin: 4
Alkaline Phosphatase: 41
BUN: 22
Chloride: 102
Potassium: 2.9 — ABNORMAL LOW
Sodium: 141
Total Bilirubin: 1.1
Total Protein: 6.5

## 2011-01-01 LAB — POCT CARDIAC MARKERS: CKMB, poc: 1.6

## 2011-01-01 LAB — DIFFERENTIAL
Basophils Absolute: 0.2 — ABNORMAL HIGH
Basophils Relative: 3 — ABNORMAL HIGH
Eosinophils Absolute: 0
Eosinophils Relative: 0
Monocytes Absolute: 0.3
Monocytes Relative: 7
Neutro Abs: 3.9

## 2011-01-01 LAB — URINE MICROSCOPIC-ADD ON

## 2011-01-01 LAB — CBC
HCT: 37.1 — ABNORMAL LOW
Platelets: 180
RDW: 13.7
WBC: 5.1

## 2011-01-01 LAB — URINALYSIS, ROUTINE W REFLEX MICROSCOPIC
Glucose, UA: NEGATIVE
Hgb urine dipstick: NEGATIVE
Ketones, ur: 40 — AB
Protein, ur: 30 — AB
pH: 6

## 2011-01-17 LAB — CBC
HCT: 37.4 — ABNORMAL LOW
Hemoglobin: 12.6 — ABNORMAL LOW
MCHC: 33.7
MCV: 95.7
RBC: 3.91 — ABNORMAL LOW
RDW: 13.9

## 2011-01-17 LAB — URINALYSIS, ROUTINE W REFLEX MICROSCOPIC
Bilirubin Urine: NEGATIVE
Leukocytes, UA: NEGATIVE
Nitrite: NEGATIVE
Specific Gravity, Urine: 1.009
pH: 8

## 2011-01-17 LAB — DIFFERENTIAL
Eosinophils Relative: 1
Monocytes Absolute: 0.3
Monocytes Relative: 8
Neutro Abs: 2.6

## 2011-01-17 LAB — POCT CARDIAC MARKERS
Myoglobin, poc: 99.4
Operator id: 284141
Troponin i, poc: <0.05

## 2011-01-17 LAB — I-STAT 8, (EC8 V) (CONVERTED LAB)
BUN: 7
Glucose, Bld: 103 — ABNORMAL HIGH
Potassium: 4.1
TCO2: 30
pH, Ven: 7.365 — ABNORMAL HIGH

## 2011-01-17 LAB — POCT I-STAT CREATININE: Operator id: 284141

## 2011-01-17 LAB — URINE MICROSCOPIC-ADD ON

## 2011-02-27 ENCOUNTER — Ambulatory Visit (INDEPENDENT_AMBULATORY_CARE_PROVIDER_SITE_OTHER): Payer: Medicare Other | Admitting: Family Medicine

## 2011-02-27 ENCOUNTER — Encounter: Payer: Self-pay | Admitting: Family Medicine

## 2011-02-27 VITALS — BP 146/83 | HR 87 | Ht 68.0 in | Wt 180.0 lb

## 2011-02-27 DIAGNOSIS — N138 Other obstructive and reflux uropathy: Secondary | ICD-10-CM

## 2011-02-27 DIAGNOSIS — Z23 Encounter for immunization: Secondary | ICD-10-CM

## 2011-02-27 DIAGNOSIS — N401 Enlarged prostate with lower urinary tract symptoms: Secondary | ICD-10-CM

## 2011-02-27 DIAGNOSIS — G4700 Insomnia, unspecified: Secondary | ICD-10-CM

## 2011-02-27 MED ORDER — MIRTAZAPINE 15 MG PO TABS: 15.0000 mg | ORAL_TABLET | Freq: Every day | ORAL | Status: DC

## 2011-02-27 MED ORDER — FINASTERIDE 5 MG PO TABS: 5.0000 mg | ORAL_TABLET | Freq: Every day | ORAL | Status: DC

## 2011-02-27 NOTE — Progress Notes (Signed)
  Subjective:    Patient ID: Tommy Gross, male    DOB: 1917/04/09, 77 y.o.   MRN: [id number]  HPI  Patient is here for sleep medication and for his Proscar. He is here with his daughter. He states that his wife has recently been put in a nursing home because of a fall that hopefully her output before Christmas. He denies any problems at 48 he uses a walker.  Review of Systems    review of systems essentially negative Objective:   Physical Exam BP 146/83  Pulse 87  Ht 5\' 8"  (1.727 m)  Wt 180 lb (81.647 kg)  BMI 27.37 kg/m2  SpO2 95%  Elderly white male vital signs stable lungs were clear heart S1-S2 rate and rhythm      Assessment & Plan:  #1 continue on Proscar for his prostate hypertrophy #2 Remeron to help him sleep at night #3 flu shot will be given return in 6 months followup should be noted discuss with his daughter and she also feels that during an very nonaggressive policy and stance for fathe ris the way to go because of his age.

## 2011-02-27 NOTE — Patient Instructions (Signed)
Return in 6 months. Will give flu shot.

## 2011-10-14 ENCOUNTER — Other Ambulatory Visit: Payer: Self-pay | Admitting: *Deleted

## 2011-10-14 DIAGNOSIS — N138 Other obstructive and reflux uropathy: Secondary | ICD-10-CM

## 2011-10-14 DIAGNOSIS — G4700 Insomnia, unspecified: Secondary | ICD-10-CM

## 2011-10-14 MED ORDER — FINASTERIDE 5 MG PO TABS
5.0000 mg | ORAL_TABLET | Freq: Every day | ORAL | Status: DC
Start: 2011-10-14 — End: 2011-11-19

## 2011-10-14 MED ORDER — MIRTAZAPINE 15 MG PO TABS: 15.0000 mg | ORAL_TABLET | Freq: Every day | ORAL | Status: DC

## 2011-11-19 ENCOUNTER — Other Ambulatory Visit: Payer: Self-pay | Admitting: *Deleted

## 2011-11-19 DIAGNOSIS — N138 Other obstructive and reflux uropathy: Secondary | ICD-10-CM

## 2011-11-19 DIAGNOSIS — G4700 Insomnia, unspecified: Secondary | ICD-10-CM

## 2011-11-19 MED ORDER — MIRTAZAPINE 15 MG PO TABS: 15.0000 mg | ORAL_TABLET | Freq: Every day | ORAL | Status: DC

## 2011-11-19 MED ORDER — FINASTERIDE 5 MG PO TABS: 5.0000 mg | ORAL_TABLET | Freq: Every day | ORAL | Status: DC

## 2011-11-26 ENCOUNTER — Ambulatory Visit (INDEPENDENT_AMBULATORY_CARE_PROVIDER_SITE_OTHER): Payer: Medicare Other | Admitting: Family Medicine

## 2011-11-26 ENCOUNTER — Encounter: Payer: Self-pay | Admitting: Family Medicine

## 2011-11-26 VITALS — BP 154/85 | HR 82 | Ht 68.0 in | Wt 189.0 lb

## 2011-11-26 DIAGNOSIS — IMO0001 Reserved for inherently not codable concepts without codable children: Secondary | ICD-10-CM

## 2011-11-26 DIAGNOSIS — Z9229 Personal history of other drug therapy: Secondary | ICD-10-CM

## 2011-11-26 DIAGNOSIS — G4700 Insomnia, unspecified: Secondary | ICD-10-CM

## 2011-11-26 DIAGNOSIS — R030 Elevated blood-pressure reading, without diagnosis of hypertension: Secondary | ICD-10-CM

## 2011-11-26 DIAGNOSIS — N401 Enlarged prostate with lower urinary tract symptoms: Secondary | ICD-10-CM

## 2011-11-26 DIAGNOSIS — N138 Other obstructive and reflux uropathy: Secondary | ICD-10-CM

## 2011-11-26 MED ORDER — FINASTERIDE 5 MG PO TABS: 5.0000 mg | ORAL_TABLET | Freq: Every day | ORAL | Status: DC

## 2011-11-26 MED ORDER — MIRTAZAPINE 15 MG PO TABS: 15.0000 mg | ORAL_TABLET | Freq: Every day | ORAL | Status: DC

## 2011-11-26 NOTE — Patient Instructions (Signed)
none

## 2011-11-26 NOTE — Progress Notes (Signed)
  Subjective:    Patient ID: Tommy Gross, male    DOB: 10/28/1917, 78 y.o.   MRN: [id number]  HPI Patient is a 78 year old white male that is here because his been over 6 months since he received his last sets of prescriptions. In fact over 9 months. According to his daughter they have recently put his wife in a rehabilitation/nursing home for a few weeks because of myocardial infarction. Amazingly the daughter reports the patient is the mother #1 caregiver and despite him using a walker he basically cooks for her and provides her meals. They wanted refill on his insomnia medication and is Proscar for his prostate.   Review of Systems  HENT: Positive for hearing loss.   Psychiatric/Behavioral: Positive for disturbed wake/sleep cycle.      BP 154/85  Pulse 82  Ht 5\' 8"  (1.727 m)  Wt 189 lb (85.73 kg)  BMI 28.74 kg/m2  SpO2 93% Objective:   Physical Exam  Vitals reviewed. Constitutional: He is oriented to person, place, and time.       Elderly white male in no distress.  HENT:  Head: Normocephalic.  Right Ear: External ear normal.  Left Ear: External ear normal.       No significant wax buildup in either ears.  Neurological: He is alert and oriented to person, place, and time.       Tommy Gross memory appears sharp as he asked me questions pertaining to my family and my living area that his daughter had to admit that she forgot.  Skin: Skin is warm and dry.  Psychiatric: He has a normal mood and affect.      Assessment & Plan:  #1 elevated blood pressure. As I explained to them I would not treat this at this time.  #2 hearing loss. It appears to be more related to ACE anything else once again would not do anything different at this time.  #3 history of prostatic hypertrophy. Renew Proscar. Number of  #4 sleep disturbance. Renew Remeron.  #5 immunization update. Immunizations were reviewed. He needs to have a flu shot which I encouraged his daughter to bring him in  sometime in the next 45-90 days. He was not completely sure when he received his last Pneumovax but since he has been in [facility] at least 3-5 years ago with a subsequent stay at a nursing home reassured that he received his Pneumovax during that time. And of course at his age he is aged out as far as colonoscopies are concerned.  Return in a year for followup.

## 2012-06-10 ENCOUNTER — Ambulatory Visit: Payer: Medicare Other | Admitting: Family Medicine

## 2012-12-29 ENCOUNTER — Encounter: Payer: Self-pay | Admitting: Family Medicine

## 2012-12-29 ENCOUNTER — Ambulatory Visit (INDEPENDENT_AMBULATORY_CARE_PROVIDER_SITE_OTHER): Payer: Medicare Other | Admitting: Family Medicine

## 2012-12-29 VITALS — BP 116/83 | HR 96 | Wt 136.0 lb

## 2012-12-29 DIAGNOSIS — F3289 Other specified depressive episodes: Secondary | ICD-10-CM

## 2012-12-29 DIAGNOSIS — Z87898 Personal history of other specified conditions: Secondary | ICD-10-CM

## 2012-12-29 DIAGNOSIS — N138 Other obstructive and reflux uropathy: Secondary | ICD-10-CM

## 2012-12-29 DIAGNOSIS — Z23 Encounter for immunization: Secondary | ICD-10-CM

## 2012-12-29 DIAGNOSIS — I951 Orthostatic hypotension: Secondary | ICD-10-CM

## 2012-12-29 DIAGNOSIS — F329 Major depressive disorder, single episode, unspecified: Secondary | ICD-10-CM

## 2012-12-29 DIAGNOSIS — D649 Anemia, unspecified: Secondary | ICD-10-CM

## 2012-12-29 DIAGNOSIS — G4700 Insomnia, unspecified: Secondary | ICD-10-CM

## 2012-12-29 DIAGNOSIS — N401 Enlarged prostate with lower urinary tract symptoms: Secondary | ICD-10-CM

## 2012-12-29 DIAGNOSIS — R634 Abnormal weight loss: Secondary | ICD-10-CM

## 2012-12-29 MED ORDER — FINASTERIDE 5 MG PO TABS: 5.0000 mg | ORAL_TABLET | Freq: Every day | ORAL | Status: DC

## 2012-12-29 MED ORDER — MIRTAZAPINE 15 MG PO TABS: 15.0000 mg | ORAL_TABLET | Freq: Every day | ORAL | Status: AC

## 2012-12-29 NOTE — Progress Notes (Signed)
CC: Tommy Gross is a 79 y.o. male is here for Medication Management and feeling lightheaded   Subjective: HPI:  Accompanied by daughter, is living at home with his wife, daughter and son are providing help with activities of daily living. They're dependent on grooming, dressing. Daughter and son are helping with preparing meals that are easy to reheat. There been no falls since he was seen last year.  Patient's only complaint is a dizziness that has been present since March of this year it is only present if he stands for longer than 5 minutes or if he stands suddenly from a seated position. He never has it when lying down or when he is seated. It is described only as a mild dizziness nothing else particularly makes it better or worse has not be getting better or worse since onset. He was evaluated at [facility] with unremarkable CBC, unremarkable metabolic panel, and no urinary tract infection. CT abdomen pelvis unremarkable. Symptoms came on gradually. He denies nasal congestion, facial pain, stuffy nose, ear pressure, hearing loss, nor motor or sensory disturbances other than above  Family is requesting refills on mirtazapine, he has been taking this for years to help with insomnia and depression. He currently states that he has no difficulty falling asleep or staying asleep nor does he have any subjective depression he attributes this to the medication. Denies any mental disturbance whatsoever  Requesting refill on finasteride. He denies trouble urinating, weak stream, hesitancy, incomplete voiding, dysuria, nor blood in the urine.  Review Of Systems Outlined In HPI  Past Medical History  Diagnosis Date  . FH: benign prostatic hypertrophy   . Anemia     Normocytic  . Depression   . Malnutrition   . Chronic constipation      Family History  Problem Relation Age of Onset  . Heart attack Daughter   . Diabetes Son   . Diabetes Son      History   Substance Use Topics  . Smoking status: Never Smoker   . Smokeless tobacco: Not on file  . Alcohol Use: No     Objective: Filed Vitals:   12/29/12 1546  BP: 116/83  Pulse: 96    General: Alert and Oriented, No Acute Distress HEENT: Pupils equal, round, reactive to light. Conjunctivae clear.  External ears unremarkable, canals clear with intact TMs with appropriate landmarks.  Middle ear appears open without effusion. Pink inferior turbinates.  Moist mucous membranes, pharynx without inflammation nor lesions.  Neck supple without palpable lymphadenopathy nor abnormal masses. Lungs: Clear to auscultation bilaterally, no wheezing/ronchi/rales.  Comfortable work of breathing. Good air movement. Cardiac: Regular rate and rhythm. Normal S1/S2.  No murmurs, rubs, nor gallops.   Extremities: No peripheral edema.  Strong peripheral pulses.  Mental Status: No depression, anxiety, nor agitation. Skin: Warm and dry.  Assessment & Plan: Tommy Gross was seen today for medication management and feeling lightheaded.  Diagnoses and associated orders for this visit:  BENIGN PROSTATIC HYPERTROPHY, HX OF  DEPRESSIVE DISORDER NOT ELSEWHERE CLASSIFIED  Loss of weight  Anemia  Prostatic hypertrophy, benign, with obstruction - finasteride (PROSCAR) 5 MG tablet; Take 1 tablet (5 mg total) by mouth daily.  Insomnia - mirtazapine (REMERON) 15 MG tablet; Take 1 tablet (15 mg total) by mouth at bedtime.  Orthostatic hypotension    BPH: Status post surgical resection based on chart review, symptoms are stable on finasteride continue Depression and insomnia: Controlled continue Remeron Anemia: March labs from [facility] show  that this has been corrected and is now controlled Dizziness: Discussed with daughter and patient I believe he has relative orthostatic hypotension causing his dizziness, I've encouraged him to increase his salt intake he is looking forward to this Weight loss: Discussed dietary  interventions including insuring that he has access to dinner that he finds appetizing since he has been skipping this meal most days of the week  Receive flu shot today  Return in about 3 months (around 03/30/2013).

## 2013-12-24 ENCOUNTER — Other Ambulatory Visit: Payer: Self-pay | Admitting: Family Medicine

## 2014-01-18 ENCOUNTER — Telehealth: Payer: Self-pay

## 2014-01-18 ENCOUNTER — Other Ambulatory Visit: Payer: Self-pay | Admitting: Family Medicine

## 2014-01-18 NOTE — Telephone Encounter (Signed)
I called Mr Tommy Gross to schedule a follow up visit with Dr Ivan AnchorsHommel.   Tommy Gross's son's wife states Mariana KaufmanMarvin passed away at the beginning of October.

## 2014-01-24 NOTE — Telephone Encounter (Signed)
I've asked Toniann FailWendy to change this patient's status to deceased.

## 2014-01-30 DEATH — deceased
